# Patient Record
Sex: Male | Born: 1990 | Race: White | Hispanic: No | Marital: Single | Smoking: Never smoker
Health system: Southern US, Community
[De-identification: ages and names within clinical notes are randomized; demographics above are authoritative.]

## PROBLEM LIST (undated history)

## (undated) DIAGNOSIS — G43909 Migraine, unspecified, not intractable, without status migrainosus: Secondary | ICD-10-CM

## (undated) HISTORY — PX: TONSILLECTOMY: SUR1361

## (undated) HISTORY — DX: Migraine, unspecified, not intractable, without status migrainosus: G43.909

## (undated) HISTORY — PX: MYRINGOTOMY: SUR874

---

## 2014-10-17 LAB — LIPID PANEL
Cholesterol: 212 mg/dL — AB (ref 0–200)
HDL: 30 mg/dL — AB (ref 35–70)
LDL CALC: 182 mg/dL
TRIGLYCERIDES: 215 mg/dL — AB (ref 40–160)

## 2014-10-17 LAB — BASIC METABOLIC PANEL
BUN: 10 mg/dL (ref 4–21)
Creatinine: 0.9 mg/dL (ref 0.6–1.3)
GLUCOSE: 85 mg/dL
Sodium: 140 mmol/L (ref 137–147)

## 2015-04-24 ENCOUNTER — Other Ambulatory Visit: Payer: Self-pay | Admitting: Physician Assistant

## 2015-04-24 DIAGNOSIS — R1310 Dysphagia, unspecified: Secondary | ICD-10-CM

## 2015-04-26 ENCOUNTER — Ambulatory Visit
Admission: RE | Admit: 2015-04-26 | Discharge: 2015-04-26 | Disposition: A | Discharge status: Home / Self Care | Payer: Managed Care, Other (non HMO) | Source: Ambulatory Visit | Attending: Physician Assistant | Admitting: Physician Assistant

## 2015-04-26 DIAGNOSIS — R1310 Dysphagia, unspecified: Secondary | ICD-10-CM

## 2015-05-18 ENCOUNTER — Emergency Department (HOSPITAL_COMMUNITY): Payer: Managed Care, Other (non HMO)

## 2015-05-18 ENCOUNTER — Emergency Department (HOSPITAL_COMMUNITY)
Admission: EM | Admit: 2015-05-18 | Discharge: 2015-05-18 | Disposition: A | Discharge status: Home / Self Care | Payer: Managed Care, Other (non HMO) | Attending: Emergency Medicine | Admitting: Emergency Medicine

## 2015-05-18 ENCOUNTER — Encounter (HOSPITAL_COMMUNITY): Payer: Self-pay | Admitting: Emergency Medicine

## 2015-05-18 DIAGNOSIS — F419 Anxiety disorder, unspecified: Secondary | ICD-10-CM | POA: Diagnosis present

## 2015-05-18 DIAGNOSIS — J449 Chronic obstructive pulmonary disease, unspecified: Secondary | ICD-10-CM | POA: Diagnosis not present

## 2015-05-18 DIAGNOSIS — R0789 Other chest pain: Secondary | ICD-10-CM | POA: Diagnosis present

## 2015-05-18 DIAGNOSIS — R000 Tachycardia, unspecified: Secondary | ICD-10-CM | POA: Diagnosis present

## 2015-05-18 DIAGNOSIS — R0602 Shortness of breath: Secondary | ICD-10-CM | POA: Diagnosis present

## 2015-05-18 DIAGNOSIS — I471 Supraventricular tachycardia: Secondary | ICD-10-CM | POA: Diagnosis not present

## 2015-05-18 DIAGNOSIS — K219 Gastro-esophageal reflux disease without esophagitis: Secondary | ICD-10-CM | POA: Diagnosis not present

## 2015-05-18 DIAGNOSIS — J439 Emphysema, unspecified: Secondary | ICD-10-CM | POA: Insufficient documentation

## 2015-05-18 DIAGNOSIS — D72829 Elevated white blood cell count, unspecified: Secondary | ICD-10-CM | POA: Diagnosis present

## 2015-05-18 DIAGNOSIS — R350 Frequency of micturition: Secondary | ICD-10-CM | POA: Diagnosis present

## 2015-05-18 LAB — CBC WITH DIFFERENTIAL/PLATELET
Basophils Absolute: 0 10*3/uL (ref 0.0–0.1)
Basophils Relative: 0 % (ref 0–1)
Eosinophils Absolute: 0.1 10*3/uL (ref 0.0–0.7)
Eosinophils Relative: 0 % (ref 0–5)
HEMATOCRIT: 43.2 % (ref 39.0–52.0)
HEMOGLOBIN: 15.2 g/dL (ref 13.0–17.0)
LYMPHS ABS: 1.7 10*3/uL (ref 0.7–4.0)
Lymphocytes Relative: 13 % (ref 12–46)
MCH: 28.7 pg (ref 26.0–34.0)
MCHC: 35.2 g/dL (ref 30.0–36.0)
MCV: 81.5 fL (ref 78.0–100.0)
MONO ABS: 0.5 10*3/uL (ref 0.1–1.0)
MONOS PCT: 4 % (ref 3–12)
NEUTROS ABS: 11.5 10*3/uL — AB (ref 1.7–7.7)
NEUTROS PCT: 84 % — AB (ref 43–77)
Platelets: 149 10*3/uL — ABNORMAL LOW (ref 150–400)
RBC: 5.3 MIL/uL (ref 4.22–5.81)
RDW: 13 % (ref 11.5–15.5)
WBC: 13.8 10*3/uL — ABNORMAL HIGH (ref 4.0–10.5)

## 2015-05-18 LAB — URINALYSIS, ROUTINE W REFLEX MICROSCOPIC
BILIRUBIN URINE: NEGATIVE
Glucose, UA: NEGATIVE mg/dL
Hgb urine dipstick: NEGATIVE
KETONES UR: 15 mg/dL — AB
LEUKOCYTES UA: NEGATIVE
NITRITE: NEGATIVE
PH: 7.5 (ref 5.0–8.0)
Protein, ur: NEGATIVE mg/dL
UROBILINOGEN UA: 1 mg/dL (ref 0.0–1.0)

## 2015-05-18 LAB — I-STAT ARTERIAL BLOOD GAS, ED
Bicarbonate: 24.4 mEq/L — ABNORMAL HIGH (ref 20.0–24.0)
O2 SAT: 96 %
TCO2: 26 mmol/L (ref 0–100)
pCO2 arterial: 39.3 mmHg (ref 35.0–45.0)
pH, Arterial: 7.401 (ref 7.350–7.450)
pO2, Arterial: 79 mmHg — ABNORMAL LOW (ref 80.0–100.0)

## 2015-05-18 LAB — RAPID URINE DRUG SCREEN, HOSP PERFORMED
Amphetamines: NOT DETECTED
Barbiturates: NOT DETECTED
Benzodiazepines: NOT DETECTED
COCAINE: NOT DETECTED
OPIATES: NOT DETECTED
TETRAHYDROCANNABINOL: NOT DETECTED

## 2015-05-18 LAB — BASIC METABOLIC PANEL
Anion gap: 11 (ref 5–15)
BUN: 8 mg/dL (ref 6–20)
CALCIUM: 9.3 mg/dL (ref 8.9–10.3)
CHLORIDE: 105 mmol/L (ref 101–111)
CO2: 22 mmol/L (ref 22–32)
Creatinine, Ser: 0.82 mg/dL (ref 0.61–1.24)
GFR calc Af Amer: >60 mL/min (ref >60)
Glucose, Bld: 141 mg/dL — ABNORMAL HIGH (ref 65–99)
Potassium: 3.3 mmol/L — ABNORMAL LOW (ref 3.5–5.1)
Sodium: 138 mmol/L (ref 135–145)

## 2015-05-18 LAB — I-STAT TROPONIN, ED: TROPONIN I, POC: 0 ng/mL (ref 0.00–0.08)

## 2015-05-18 LAB — TROPONIN I: TROPONIN I: 0.04 ng/mL — AB (ref <0.031)

## 2015-05-18 LAB — TSH: TSH: 1.951 u[IU]/mL (ref 0.350–4.500)

## 2015-05-18 LAB — D-DIMER, QUANTITATIVE (NOT AT ARMC)

## 2015-05-18 MED ORDER — IOHEXOL 350 MG/ML SOLN
100.0000 mL | Freq: Once | INTRAVENOUS | Status: AC | PRN
  Administered 2015-05-18: 60 mL via INTRAVENOUS

## 2015-05-18 MED ORDER — PANTOPRAZOLE SODIUM 20 MG PO TBEC: 40.0000 mg | DELAYED_RELEASE_TABLET | Freq: Every day | ORAL | Status: DC

## 2015-05-18 MED ORDER — SODIUM CHLORIDE 0.9 % IV BOLUS (SEPSIS)
1000.0000 mL | Freq: Once | INTRAVENOUS | Status: AC
  Administered 2015-05-18: 1000 mL via INTRAVENOUS

## 2015-05-18 MED ORDER — LORAZEPAM 1 MG PO TABS: 0.5000 mg | ORAL_TABLET | Freq: Three times a day (TID) | ORAL | Status: DC | PRN

## 2015-05-18 MED ORDER — GI COCKTAIL ~~LOC~~: 30.0000 mL | Freq: Three times a day (TID) | ORAL | Status: DC | PRN

## 2015-05-18 MED ORDER — POTASSIUM CHLORIDE CRYS ER 20 MEQ PO TBCR
40.0000 meq | EXTENDED_RELEASE_TABLET | Freq: Once | ORAL | Status: AC
  Administered 2015-05-18: 40 meq via ORAL
  Filled 2015-05-18: qty 2

## 2015-05-18 MED ORDER — LORAZEPAM 2 MG/ML IJ SOLN
0.5000 mg | Freq: Once | INTRAMUSCULAR | Status: AC
  Administered 2015-05-18: 0.5 mg via INTRAVENOUS
  Filled 2015-05-18: qty 1

## 2015-05-18 NOTE — Discharge Instructions (Signed)
Please follow up with your primary care provider in the next week.  Please take your prescription for Ativan as needed and take  Protonix once daily.  Please contact Pulmonology for a follow up appointment regarding lung bulla seen on CT chest scan.  Please return to the Emergency Department if symptoms worsen.

## 2015-05-18 NOTE — ED Provider Notes (Signed)
CSN: [id number]     Arrival date & time 05/18/15  0004 History   First MD Initiated Contact with Patient 05/18/15 0304     Chief Complaint  Patient presents with  . Shortness of Breath     (Consider location/radiation/quality/duration/timing/severity/associated sxs/prior Treatment) Patient is a 24 y.o. male presenting with shortness of breath. The history is provided by the patient. No language interpreter was used.  Shortness of Breath Severity:  Moderate Onset quality:  Gradual Duration:  8 hours Timing:  Constant Progression:  Unchanged Chronicity:  New Context: not activity, not animal exposure, not fumes, not occupational exposure, not pollens, not smoke exposure, not strong odors and not URI   Relieved by:  Nothing Worsened by:  Activity, exertion and emotional stress Ineffective treatments:  None tried Associated symptoms: cough   Associated symptoms: no abdominal pain, no chest pain, no claudication, no fever, no headaches, no hemoptysis, no sore throat, no sputum production, no vomiting and no wheezing   Cough:    Cough characteristics:  Dry   Severity:  Moderate   Onset quality:  Gradual   Duration:  2 days   Timing:  Intermittent   Progression:  Unchanged   Chronicity:  New Risk factors: no recent alcohol use, no family hx of DVT, no hx of cancer, no hx of PE/DVT, no obesity, no oral contraceptive use, no prolonged immobilization, no recent surgery and no tobacco use   Risk factors comment:  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 No pertinent past medical history. Past Surgical History  Procedure Laterality Date  . Tonsillectomy    . Myringotomy     No family history on file. Social History  Substance Use Topics  . Smoking status: Never Smoker   . Smokeless tobacco: None  . Alcohol Use: No    Review of Systems  Constitutional: Negative for fever.  HENT: Negative for sore throat.   Respiratory: Positive for cough and  shortness of breath. Negative for hemoptysis, sputum production and wheezing.   Cardiovascular: Negative for chest pain and claudication.  Gastrointestinal: Negative for vomiting and abdominal pain.  Neurological: Negative for headaches.  All other systems reviewed and are negative.     Allergies  Review of patient's allergies indicates no known allergies.  Home Medications   Prior to Admission medications   Not on File   BP 149/85 mmHg  Pulse 125  Temp(Src) 98.8 F (37.1 C) (Oral)  Resp 22  SpO2 97% Physical Exam  Constitutional: He is oriented to person, place, and time. He appears well-developed and well-nourished. No distress.  HENT:  Head: Normocephalic and atraumatic.  Eyes: Conjunctivae and EOM are normal.  Neck: Normal range of motion.  Cardiovascular: Regular rhythm.  Exam reveals no gallop and no friction rub.   No murmur heard. tachycardic  Pulmonary/Chest: Effort normal and breath sounds normal. He has no wheezes. He has no rales. He exhibits no tenderness.  Abdominal: Soft. He exhibits no distension. There is no tenderness. There is no rebound.  Musculoskeletal: Normal range of motion.  Neurological: He is alert and oriented to person, place, and time. Coordination normal.  Speech is goal-oriented. Moves limbs without ataxia.   Skin: Skin is warm and dry.  Psychiatric: He has a normal mood and affect. His behavior is normal.  Nursing note and vitals reviewed.   ED Course  Procedures (including critical care time) Labs Review Labs Reviewed  CBC WITH DIFFERENTIAL/PLATELET - Abnormal; Notable for the  following:    WBC 13.8 (*)    Platelets 149 (*)    Neutrophils Relative % 84 (*)    Neutro Abs 11.5 (*)    All other components within normal limits  BASIC METABOLIC PANEL - Abnormal; Notable for the following:    Potassium 3.3 (*)    Glucose, Bld 141 (*)    All other components within normal limits  TROPONIN I - Abnormal; Notable for the following:     Troponin I 0.04 (*)    All other components within normal limits  URINALYSIS, ROUTINE W REFLEX MICROSCOPIC (NOT AT [facility]) - Abnormal; Notable for the following:    Specific Gravity, Urine <1.005 (*)    Ketones, ur 15 (*)    All other components within normal limits  I-STAT ARTERIAL BLOOD GAS, ED - Abnormal; Notable for the following:    pO2, Arterial 79.0 (*)    Bicarbonate 24.4 (*)    All other components within normal limits  CULTURE, BLOOD (ROUTINE X 2)  CULTURE, BLOOD (ROUTINE X 2)  URINE CULTURE  D-DIMER, QUANTITATIVE (NOT AT [facility])  URINE RAPID DRUG SCREEN, HOSP PERFORMED  TSH  TROPONIN I  Rosezena Sensor, ED    Imaging Review Dg Chest 2 View  05/18/2015   CLINICAL DATA:  24 year old male with shortness of breath and cough  EXAM: CHEST  2 VIEW  COMPARISON:  None.  FINDINGS: The heart size and mediastinal contours are within normal limits. Both lungs are clear. The visualized skeletal structures are unremarkable.  IMPRESSION: No active cardiopulmonary disease.   Electronically Signed   By: Elgie Collard M.D.   On: 05/18/2015 00:32   Ct Angio Chest Pe W/cm &/or Wo Cm  05/18/2015   CLINICAL DATA:  Shortness of breath, worse with exertion.  EXAM: CT ANGIOGRAPHY CHEST WITH CONTRAST  TECHNIQUE: Multidetector CT imaging of the chest was performed using the standard protocol during bolus administration of intravenous contrast. Multiplanar CT image reconstructions and MIPs were obtained to evaluate the vascular anatomy.  CONTRAST:  60mL OMNIPAQUE IOHEXOL 350 MG/ML SOLN  COMPARISON:  Chest radiograph dated 05/18/2015.  FINDINGS: There is no evidence of pulmonary embolus.  The heart and great vessels are normal. Residual thymus is seen. There is no evidence of significant focal lung parenchymal consolidation, pleural effusion or pneumothorax. No masses are identified ; no abnormal focal contrast enhancement is seen. There is a single subpleural bulla within the superior segment of the left lower  lobe measuring 2.3 cm. , incidental finding.  No axillary lymphadenopathy is seen. The visualized portions of the thyroid gland are unremarkable in appearance.  The visualized portions of the liver, gallbladder, stomach and spleen are unremarkable.  No acute osseous abnormalities are seen.  Review of the MIP images confirms the above findings.  IMPRESSION: No evidence of pulmonary embolus.  No evidence of acute cardiopulmonary disease.   Electronically Signed   By: Ted Mcalpine M.D.   On: 05/18/2015 08:13   I have personally reviewed and evaluated these images and lab results as part of my medical decision-making.   EKG Interpretation   Date/Time:  Thursday May 18 2015 00:09:56 EDT Ventricular Rate:  146 PR Interval:  128 QRS Duration: 78 QT Interval:  296 QTC Calculation: 461 R Axis:   95 Text Interpretation:  Sinus tachycardia Rightward axis T wave abnormality,  consider inferior ischemia Abnormal ECG No old tracing to compare  Confirmed by [facility]  MD, DAVID ([id number]) on 05/18/2015 12:13:07 AM  MDM   Final diagnoses:  SOB (shortness of breath)  Tachycardia  Anxiety  Lung bullae    3:28 AM Labs pending. EKG shows sinus tachycardia with rightward axis. Patient persistently tachycardic.   Patient will have CT angio of chest to rule out PE. Patient signed out to Melburn Hake, PA-C.   Emilia Beck, PA-C 05/18/15 1610  Dione Booze, MD 05/18/15 2234

## 2015-05-18 NOTE — Consult Note (Signed)
[facility] Medical Consultation  Earl Foster ZOX:[id number] DOB: Nov 22, 1990 DOA: 05/18/2015 PCP: Gardenia Phlegm   Requesting physician: Melburn Hake, PA-C Date of consultation: 05/18/2015 Reason for consultation: Consideration for admission for tachycardia.  Impression/Recommendations Active Problems:   Sinus tachycardia   Anxiety   Atypical chest pain   Bleb, lung   Leukocytosis   Urinary frequency   GERD (gastroesophageal reflux disease)   PCP follow up recommendations:  Please see the patient within the next 7 days for ER follow up. 1.  Evaluation of any continued CP or tachycardia. 2.  Pulmonary follow up for lung bulla and chronic allergy / asthma symptoms. 3.  PPI therapy for GERD 4.  Treatment for anxiety - or possible specialist referral  5.  Continued Follow up of leukocytosis  6.  Follow TSH. 7.  Check CBC / BMET  Narrow Complex Sinus Tachycardia Likely secondary to anxiety/panic attack.  CTA chest ruled out PE. Troponin is minimally elevated.  Will check a second troponin, TSH, UDS Treated in the ER with ativan and IV fluids and improved.   Recommend the patient follow up with his PCP for on-going treatment and evaluation.  Please follow TSH.  Atypical chest pain Described as intermittent, burning and pinpoint.  He rates his pain as a "0.01".  EKG with out ST changes.  Troponin minimally elevated.  Will check a second troponin prior to d/c from the ER. Patient had a recent UGI that demonstrated moderate reflux.  Treated with GI cocktail in the ER. Recommend discharging the patient from the ER on a daily PPI such as protonix.  Solitary Left lobe lung bulla Incidental finding on CTA chest (results are listed below). I do not believe his symptoms stem from this bulla. May consider checking an alpha 1 antitrypsin antibody as an outpatient.  I briefly discussed this with the Pulmonology PA who recommended pulmonology follow up given his history of  allergies and asthma.  EDP agreed to provide information to the patient regarding Pulmonology follow up.  Urinary frequency Will check U/A prior to d/c from the ER.  Doubt infection.   GERD Recommend initiation of PPI therapy at ER discharge.  To be followed by PCP.   May need upper endoscopy at some point.  Leukocytosis Patient states his WBC was elevated at his last PCP visit.  May be chronically elevated.  CXR clear for infection.  Checking U/A prior to d/c from the ER.  Recommend follow up with his PCP.  Hypokalemia Given 40 meq of oral potassium in the ER prior to D/C.  Please follow bmet.   Chief Complaint: Shortness of breath and "minor" chest pain  HPI:  Earl Foster  is a 24 y.o. male, who has migraine headaches,reflux, allergies and anxiety, developed a few hours of SOB at home on 9/7 and decided to come to the ER to get it checked out.  While driving himself here he began to worry and developed a full panic attack.  In the ER he was being treated for anxiety when he developed a minor - dull, burning chest pain in his left chest.  The pain comes and goes and has decreased with the treatment for anxiety.  He has had a dry cough for several days, but attributes this to allergies.  Specifically he has a new pet [place] pig and coughs when he is in the same room with it.    He had a recent UGI on 8/17 which showed moderate reflux.  In the ER  his peak pulse rate was 138, EKG showed sinus tach.  Troponin was 0.04.  WBC was elevated at 13.8, platelets 149, potassium 3.3.   CTA was negative for PE but had an incidental finding of a 2.3 single pleural bleb.  Review of Systems:  He reports increased frequency of urine, occasional headaches and reflux. He denies changes in vision, dizziness, sore throat, fever, anorexia, productive cough, severe chest pain, dyspnea on exertion, orthopnea, PND, abdominal pain, vomiting, diarrhea, constipation, pain with urination, myalgias, pain in his  extremities.  History reviewed. No pertinent past medical history. Past Surgical History  Procedure Laterality Date  . Tonsillectomy    . Myringotomy     Social History:  reports that he has never smoked. He does not have any smokeless tobacco history on file. He reports that he does not drink alcohol or use illicit drugs.  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 He is independent with his ADLs.  No Known Allergies  No family history known. The patient is adopted.       Physical Exam: Filed Vitals:   05/18/15 0859 05/18/15 0900 05/18/15 0915 05/18/15 1000  BP: 140/89 139/89  144/86  Pulse: 114 97 114 105  Temp: 98.2 F (36.8 C)     TempSrc: Oral     Resp: 21 21 22 23   SpO2: 99% 95% 99% 95%      General:  Young, well-developed well-nourished male standing in his room in no apparent distress.  Eyes: Pupils equal and round, sclera clear conjunctiva pink  ENT: No exudates or erythema  Neck: Supple, no frank lymphadenopathy.  Cardiovascular: Tachycardic without murmurs rubs or gallops.  Respiratory:  Clear to auscultation, no wheezes crackles or rales.  Abdomen: Soft, nontender, nondistended, positive bowel sounds, no masses, slightly protuberant  Skin: No obvious rashes bruises or lesions.  Musculoskeletal: Able to move all 4 extremities 5 over 5 strength in each.  Psychiatric: Awake, alert and oriented 4, appropriate, mildly anxious.  Neurologic: No focal neuro deficits, cranial nerves II through XII are grossly intact.  Labs on Admission:  Basic Metabolic Panel:  Recent Labs Lab 05/18/15 0340  NA 138  K 3.3*  CL 105  CO2 22  GLUCOSE 141*  BUN 8  CREATININE 0.82  CALCIUM 9.3   CBC:  Recent Labs Lab 05/18/15 0340  WBC 13.8*  NEUTROABS 11.5*  HGB 15.2  HCT 43.2  MCV 81.5  PLT 149*   Cardiac Enzymes:  Recent Labs Lab 05/18/15 0340  TROPONINI 0.04*    Radiological Exams on Admission: Dg Chest 2 View  05/18/2015   CLINICAL DATA:  25 year old  male with shortness of breath and cough  EXAM: CHEST  2 VIEW  COMPARISON:  None.  FINDINGS: The heart size and mediastinal contours are within normal limits. Both lungs are clear. The visualized skeletal structures are unremarkable.  IMPRESSION: No active cardiopulmonary disease.   Electronically Signed   By: Elgie Collard M.D.   On: 05/18/2015 00:32   Ct Angio Chest Pe W/cm &/or Wo Cm  05/18/2015   CLINICAL DATA:  Shortness of breath, worse with exertion.  EXAM: CT ANGIOGRAPHY CHEST WITH CONTRAST  TECHNIQUE: Multidetector CT imaging of the chest was performed using the standard protocol during bolus administration of intravenous contrast. Multiplanar CT image reconstructions and MIPs were obtained to evaluate the vascular anatomy.  CONTRAST:  60mL OMNIPAQUE IOHEXOL 350 MG/ML SOLN  COMPARISON:  Chest radiograph dated 05/18/2015.  FINDINGS: There is no evidence of pulmonary embolus.  The heart and great vessels are normal. Residual thymus is seen. There is no evidence of significant focal lung parenchymal consolidation, pleural effusion or pneumothorax. No masses are identified ; no abnormal focal contrast enhancement is seen. There is a single subpleural bulla within the superior segment of the left lower lobe measuring 2.3 cm. , incidental finding.  No axillary lymphadenopathy is seen. The visualized portions of the thyroid gland are unremarkable in appearance.  The visualized portions of the liver, gallbladder, stomach and spleen are unremarkable.  No acute osseous abnormalities are seen.  Review of the MIP images confirms the above findings.  IMPRESSION: No evidence of pulmonary embolus.  No evidence of acute cardiopulmonary disease.   Electronically Signed   By: Ted Mcalpine M.D.   On: 05/18/2015 08:13    EKG: Sinus Tach  Time spent: 40 min.  [place] [facility] Pager [phone]  If 7PM-7AM, please contact night-coverage [email or web address] Password [email or web address] 05/18/2015, 11:19  AM

## 2015-05-18 NOTE — ED Notes (Signed)
Pt. reports SOB worse with exertion and anxiety attack this evening with occasional dry cough , denies fever or chills.

## 2015-05-18 NOTE — ED Notes (Signed)
Provider at bedside

## 2015-05-18 NOTE — ED Notes (Signed)
NAD at this time. Pt is stable and going home.  

## 2015-05-18 NOTE — ED Provider Notes (Signed)
Pt presents with SOB worse with exertion and CP associated with an anxiety attack reported by the pt last night. Endorses dry cough. Denies fever, chills, diaphoresis, abdominal pain, N/V/D, numbness, tingling, weakness.   VSS with exception of tachycardia, afebrile. Lungs CTAB. Heart exam revealed normal S1, S2, no murmur, tachycardia.   EKG revealed tachycardia. Pt given IVF. Initial troponin 0.04. BMP unremarkable. WBC 13.8. Delta troponin negative. D-dimer negative. CXR negative. CT angio chest showed no evidence of PE or acute cardiopulmonary disease, subpleural bulla noted in left lower lobe (incidental finding).   Pt continues to remain tachycardic with HR in 120s. He reports SOB has improved. Pt given 0.5mg  Ativan. He continues to remain in 120s. Consulted hospitalist, Dr. Janee Morn will see pt in ED. Dr. Janee Morn recommended that pt does not need admission at this time. Hospitalist placed orders for IVF and also ordered TSH, UA, UDS, blood cultures, and another troponin. TSH pending, UDS negative, UA unremarkable, troponin negative. Discussed plan to follow up with pt's PCP in the next week, follow up with results from TSH with PCP, d/c pt home with PPI and ativan, and have pt follow up with Pulmonology regarding incidental bulla finding on CT. Discussed plan for d/c with pt and pt agrees with plan.   Evaluation does not show pathology requring ongoing emergent intervention or admission. Pt is hemodynamically stable and mentating appropriately. Discussed findings/results and plan with patient/guardian, who agrees with plan. All questions answered. Return precautions discussed and outpatient follow up given.    Satira Sark [place], [place] 05/18/15 1218  Bethann Berkshire, MD 05/19/15 [phone]

## 2015-05-19 LAB — URINE CULTURE

## 2015-05-23 LAB — CULTURE, BLOOD (ROUTINE X 2)
CULTURE: NO GROWTH
Culture: NO GROWTH

## 2015-05-30 ENCOUNTER — Ambulatory Visit (INDEPENDENT_AMBULATORY_CARE_PROVIDER_SITE_OTHER): Payer: Managed Care, Other (non HMO) | Admitting: Internal Medicine

## 2015-05-30 ENCOUNTER — Institutional Professional Consult (permissible substitution): Payer: Managed Care, Other (non HMO) | Admitting: Internal Medicine

## 2015-05-30 ENCOUNTER — Encounter: Payer: Self-pay | Admitting: Internal Medicine

## 2015-05-30 VITALS — BP 134/90 | HR 90 | Ht 71.0 in | Wt 196.0 lb

## 2015-05-30 DIAGNOSIS — R05 Cough: Secondary | ICD-10-CM | POA: Diagnosis not present

## 2015-05-30 DIAGNOSIS — J439 Emphysema, unspecified: Secondary | ICD-10-CM

## 2015-05-30 DIAGNOSIS — F419 Anxiety disorder, unspecified: Secondary | ICD-10-CM | POA: Diagnosis not present

## 2015-05-30 DIAGNOSIS — R058 Other specified cough: Secondary | ICD-10-CM

## 2015-05-30 NOTE — Patient Instructions (Addendum)
Pantoprazole 40 mg Take 30- 60 min before your first and last meals of the day   GERD (REFLUX)  is an extremely common cause of respiratory symptoms just like yours , many times with no obvious heartburn at all.    It can be treated with medication, but also with lifestyle changes including elevation of the head of your bed (ideally with 6 inch  bed blocks),  Smoking cessation, avoidance of late meals, excessive alcohol, and avoid fatty foods, chocolate, peppermint, colas, red wine, and acidic juices such as orange juice.  NO MINT OR MENTHOL PRODUCTS SO NO COUGH DROPS  USE SUGARLESS CANDY INSTEAD (Jolley ranchers or Stover's or Life Savers) or even ice chips will also do - the key is to swallow to prevent all throat clearing. NO OIL BASED VITAMINS - use powdered substitutes.    Weight control is simply a matter of calorie balance which needs to be tilted in your favor by eating less and exercising more.  To get the most out of exercise, you need to be continuously aware that you are short of breath, but never out of breath, for 30 minutes daily. As you improve, it will actually be easier for you to do the same amount of exercise  in  30 minutes so always push to the level where you are short of breath.    Pulmonary follow up is as needed

## 2015-05-30 NOTE — Progress Notes (Signed)
Subjective:    Patient ID: Earl Foster, male    DOB: 1991-06-20,     MRN: [id number]  HPI  24 yowm born 6 weeks prematurely [place] with ear infections and asthma dx while living [place] 2002 [place] with sob requiring lots of inhalers and allergy while in [place] then better after moved out s need need for shots or inhalers referred to pulmonary clinic 05/30/2015 by Dr Altamease Oiler Redmon p er eval for palpitations > dx  Panic disorder and  bullous lung dz and referred to pulmonary clinic 05/30/2015    05/30/2015 1st [facility] office visit/ [facility]   Chief Complaint  Patient presents with  . Pulmonary Consult    Referred by Milus Height, PA. Pt c/o CP for the past month. He states that his CP is always present, but gets worse at times.  He also c/o occ "pounding and fluttering".  He also c/o non prod cough that started approx 1 wk prior to onset of CP.    onset of cough was relatively abrupt around  05/11/15 then cp very localized area L parasternal just inf to [place]  not made worse by coughing but  continuous 24/7 but worse secs/ min does not wake him up but "made him anxious which made him short of breath"> ER 05/18/15  with small bulla post/inferiorly on L   Cough is dry day > noct assoc with new mild dysphagia and has documented gerd but not taking ppi presribed   No obvious other patterns in day to day or daytime variabilty or assoc   chest tightness, subjective wheeze overt sinus or hb symptoms. No unusual exp hx    Sleeping ok without nocturnal  or early am exacerbation  of respiratory  c/o's or need for noct saba. Also denies any obvious fluctuation of symptoms with weather or environmental changes or other aggravating or alleviating factors except as outlined above   Current Medications, Allergies, Complete Past Medical History, Past Surgical History, Family History, and Social History were reviewed in [employer] record.      Review of Systems    Constitutional: Negative for fever, chills, activity change, appetite change and unexpected weight change.  HENT: Positive for trouble swallowing. Negative for congestion, dental problem, postnasal drip, rhinorrhea, sneezing, sore throat and voice change.   Eyes: Negative for visual disturbance.  Respiratory: Positive for cough. Negative for choking and shortness of breath.   Cardiovascular: Positive for chest pain. Negative for leg swelling.  Gastrointestinal: Negative for nausea, vomiting and abdominal pain.  Genitourinary: Negative for difficulty urinating.       Indigestion  Musculoskeletal: Negative for arthralgias.  Skin: Negative for rash.  Psychiatric/Behavioral: Negative for behavioral problems and confusion.       Objective:   Physical Exam  amb wm nad  Wt Readings from Last 3 Encounters:  05/30/15 196 lb (88.905 kg)    Vital signs reviewed  HEENT: nl dentition, turbinates, and orophanx. Nl external ear canals without cough reflex   NECK :  without JVD/Nodes/TM/ nl carotid upstrokes bilaterally   LUNGS: no acc muscle use, clear to A and P bilaterally without cough on insp or exp maneuvers   CV:  RRR  no s3 or murmur or increase in P2, no edema   ABD:  soft and nontender with nl excursion in the supine position. No bruits or organomegaly, bowel sounds nl  MS:  warm without deformities, calf tenderness, cyanosis or clubbing  SKIN: warm  and dry without lesions    NEURO:  alert, approp, no deficits     I personally reviewed images and agree with radiology impression as follows:  CTa chest:  05/18/15 No evidence of pulmonary embolus. No evidence of acute cardiopulmonary disease.  single subpleural bulla within the superior segment of the left lower lobe measuring 2.3 cm  05/18/15 ER labs reviewed and were unremarkable   04/26/15 DgEs 1. Mild to moderate gastroesophageal reflux. No hiatal hernia. 2. Barium pill passes into the stomach without delay. 3.  Retained food debris within the stomach but no significant abnormality is seen.         Assessment & Plan:

## 2015-05-31 DIAGNOSIS — R058 Other specified cough: Secondary | ICD-10-CM | POA: Insufficient documentation

## 2015-05-31 DIAGNOSIS — R05 Cough: Secondary | ICD-10-CM | POA: Insufficient documentation

## 2015-05-31 NOTE — Assessment & Plan Note (Addendum)
The most common causes of chronic cough in immunocompetent adults include the following: upper airway cough syndrome (UACS), previously referred to as postnasal drip syndrome (PNDS), which is caused by variety of rhinosinus conditions; (2) asthma; (3) GERD; (4) chronic bronchitis from cigarette smoking or other inhaled environmental irritants; (5) nonasthmatic eosinophilic bronchitis; and (6) bronchiectasis.   These conditions, singly or in combination, have accounted for up to 94% of the causes of chronic cough in prospective studies.   Other conditions have constituted no >6% of the causes in prospective studies These have included bronchogenic carcinoma, chronic interstitial pneumonia, sarcoidosis, left ventricular failure, ACEI-induced cough, and aspiration from a condition associated with pharyngeal dysfunction.    Chronic cough is often simultaneously caused by more than one condition. A single cause has been found from 38 to 82% of the time, multiple causes from 18 to 62%. Multiply caused cough has been the result of three diseases up to 42% of the time.       Based on hx and exam, this is most likely:  Classic Upper airway cough syndrome, so named because it's frequently impossible to sort out how much is  CR/sinusitis with freq throat clearing (which can be related to primary GERD)   vs  causing  secondary (" extra esophageal")  GERD from wide swings in gastric pressure that occur with throat clearing, often  promoting self use of mint and menthol lozenges that reduce the lower esophageal sphincter tone and exacerbate the problem further in a cyclical fashion.   These are the same pts (now being labeled as having "irritable larynx syndrome" by some cough centers) who not infrequently have a history of having failed to tolerate ace inhibitors,  dry powder inhalers or biphosphonates or report having atypical reflux symptoms that don't respond to standard doses of PPI , and are easily confused as  having aecopd or asthma flares by even experienced allergists/ pulmonologists.   The first step is to maximize GERD Rx then regroup in 6 weeks if cough persists or cp does not resolve  I had an extended discussion with the patient reviewing all relevant studies completed to date and  lasting 35 min  1) Explained: The standardized cough guidelines published in Chest by [place] in 2006 are still the best available and consist of a multiple step process (up to 12!) , not a single office visit,  and are intended  to address this problem logically,  with an alogrithm dependent on response to empiric treatment at  each progressive step  to determine a specific diagnosis with  minimal addtional testing needed. Therefore if adherence is an issue or can't be accurately verified,  it's very unlikely the standard evaluation and treatment will be successful here.    Furthermore, response to therapy (other than acute cough suppression, which should only be used short term with avoidance of narcotic containing cough syrups if possible), can be a gradual process for which the patient may perceive immediate benefit.  Unlike going to an eye doctor where the best perscription is almost always the first one and is immediately effective, this is almost never the case in the management of chronic cough syndromes. Therefore the patient needs to commit up front to consistently adhere to recommendations  for up to 6 weeks of therapy directed at the likely underlying problem(s) before the response can be reasonably evaluated.     2) Each maintenance medication was reviewed in detail including most importantly the difference between maintenance and prns  and under what circumstances the prns are to be triggered using an action plan format that is not reflected in the computer generated alphabetically organized AVS.    Please see instructions for details which were reviewed in writing and the patient given a copy  highlighting the part that I personally wrote and discussed at today's ov.   See instructions for specific recommendations which were reviewed directly with the patient who was given a copy with highlighter outlining the key components.

## 2015-05-31 NOTE — Assessment & Plan Note (Signed)
Note none of his symptoms worsen with ex which is the best non-pharmacologic way to treat anxiety/ panic disorder  See instructions for specific recommendations which were reviewed directly with the patient who was given a copy with highlighter outlining the key components.

## 2015-05-31 NOTE — Assessment & Plan Note (Addendum)
-  Born 6 weks premature but ? On vent ?  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     In the setting of obvious "macroscopic" health issues which are clearly not related to this bulla, told pt to focus on solving these first and unlikely this bulla will ever get bigger or cause ptx, esp if not going to extremes of altitude or submersion  Discussed in detail all the  indications, usual  risks and alternatives  relative to the benefits with patient who agrees to proceed with conservative f/u as outlined

## 2015-06-07 ENCOUNTER — Ambulatory Visit (INDEPENDENT_AMBULATORY_CARE_PROVIDER_SITE_OTHER): Payer: Managed Care, Other (non HMO) | Admitting: Neurology

## 2015-06-07 ENCOUNTER — Encounter: Payer: Self-pay | Admitting: Neurology

## 2015-06-07 VITALS — BP 122/66 | HR 70 | Ht 69.0 in | Wt 194.0 lb

## 2015-06-07 DIAGNOSIS — F419 Anxiety disorder, unspecified: Secondary | ICD-10-CM | POA: Diagnosis not present

## 2015-06-07 DIAGNOSIS — G43009 Migraine without aura, not intractable, without status migrainosus: Secondary | ICD-10-CM | POA: Diagnosis not present

## 2015-06-07 DIAGNOSIS — R413 Other amnesia: Secondary | ICD-10-CM | POA: Diagnosis not present

## 2015-06-07 MED ORDER — NAPROXEN SODIUM 550 MG PO TABS: 550.0000 mg | ORAL_TABLET | Freq: Two times a day (BID) | ORAL | Status: AC | PRN

## 2015-06-07 NOTE — Patient Instructions (Addendum)
Migraine Recommendations: 2.  Take Relpax (with or without naproxen ) at earliest onset of headache.  May repeat Relpax (not naproxen) once in 2 hours if needed.  Do not exceed two tablets in 24 hours. 3.  Limit use of pain relievers to no more than 2 days out of the week.  These medications include acetaminophen, ibuprofen, triptans and narcotics.  This will help reduce risk of rebound headaches. 4.  Be aware of common food triggers such as processed sweets, processed foods with nitrites (such as deli meat, hot dogs, sausages), foods with MSG, alcohol (such as wine), chocolate, certain cheeses, certain fruits (dried fruits, some citrus fruit), vinegar, diet soda. 4.  Avoid caffeine 5.  Routine exercise 6.  Proper sleep hygiene 7.  Stay adequately hydrated with water 8.  Keep a headache diary. 9.  Maintain proper stress management. 10.  Do not skip meals. 11.  Consider supplements:  Magnesium oxide  to  daily, riboflavin , Coenzyme Q 10  three times daily 12.  Will get MRI of brain without contrast 13.  Follow up in 3 months.   Your MRI of the brain is scheduled on 06/14/2015 Wed at 4pm
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
## 2015-06-14 ENCOUNTER — Ambulatory Visit (HOSPITAL_COMMUNITY)
Admission: RE | Admit: 2015-06-14 | Discharge: 2015-06-14 | Disposition: A | Discharge status: Home / Self Care | Payer: Managed Care, Other (non HMO) | Source: Ambulatory Visit | Attending: Neurology | Admitting: Neurology

## 2015-06-14 DIAGNOSIS — R413 Other amnesia: Secondary | ICD-10-CM

## 2015-06-14 DIAGNOSIS — F419 Anxiety disorder, unspecified: Secondary | ICD-10-CM

## 2015-06-14 DIAGNOSIS — G43009 Migraine without aura, not intractable, without status migrainosus: Secondary | ICD-10-CM

## 2015-06-22 ENCOUNTER — Telehealth: Payer: Self-pay | Admitting: Neurology

## 2015-06-22 NOTE — Telephone Encounter (Signed)
Pt called for a med to keep him calm for an upcoming MRI/ call back @ [phone]

## 2015-06-22 NOTE — Telephone Encounter (Signed)
Please advise 

## 2015-06-23 ENCOUNTER — Other Ambulatory Visit: Payer: Self-pay | Admitting: *Deleted

## 2015-06-23 MED ORDER — DIAZEPAM 5 MG PO TABS: 5.0000 mg | ORAL_TABLET | Freq: Once | ORAL | Status: DC

## 2015-06-23 NOTE — Telephone Encounter (Signed)
He can have one Valium 5mg  to take 15 to 30 minutes prior to MRI.  Due to drowsiness, he will need somebody to drive him to and from the exam.

## 2015-06-23 NOTE — Telephone Encounter (Signed)
Left message informing patient that I will fax Rx in for him today.

## 2015-09-14 ENCOUNTER — Ambulatory Visit: Payer: Managed Care, Other (non HMO) | Admitting: Neurology

## 2015-09-25 ENCOUNTER — Inpatient Hospital Stay: Admission: RE | Admit: 2015-09-25 | Payer: Managed Care, Other (non HMO) | Source: Ambulatory Visit

## 2015-10-10 ENCOUNTER — Ambulatory Visit: Payer: Managed Care, Other (non HMO) | Admitting: Neurology

## 2015-11-17 ENCOUNTER — Other Ambulatory Visit: Payer: Self-pay | Admitting: Neurology

## 2015-11-20 ENCOUNTER — Ambulatory Visit: Admission: RE | Admit: 2015-11-20 | Payer: Managed Care, Other (non HMO) | Source: Ambulatory Visit

## 2015-12-05 ENCOUNTER — Telehealth: Payer: Self-pay | Admitting: Neurology

## 2015-12-05 DIAGNOSIS — R413 Other amnesia: Secondary | ICD-10-CM

## 2015-12-05 NOTE — Telephone Encounter (Signed)
Pt could not do the MRI and would like to talk to someone please call [phone]

## 2015-12-05 NOTE — Telephone Encounter (Signed)
Called pt back. No answer. Left a vm to return call or send me a mychart message

## 2015-12-05 NOTE — Telephone Encounter (Signed)
Then we can get a CT of head instead

## 2015-12-05 NOTE — Telephone Encounter (Signed)
Patient called back. Was unable to go through with MRI due to his claustrophobia. He did have and take a Valium 5 mg.  Please advise.

## 2015-12-06 NOTE — Addendum Note (Signed)
Addended by: Sheilah MinsFOX, Donyell Carrell A on: 12/06/2015 02:01 PM   Modules accepted: Orders

## 2015-12-06 NOTE — Telephone Encounter (Signed)
Left message on machine for pt to return call to the office.  

## 2015-12-06 NOTE — Telephone Encounter (Signed)
Pt okay with going through with CT. Pt is scheduled for next Wednesday. Pt aware.

## 2015-12-13 ENCOUNTER — Inpatient Hospital Stay: Admission: RE | Admit: 2015-12-13 | Payer: Managed Care, Other (non HMO) | Source: Ambulatory Visit

## 2016-03-05 ENCOUNTER — Ambulatory Visit: Payer: Managed Care, Other (non HMO) | Admitting: Neurology

## 2016-03-05 DIAGNOSIS — Z029 Encounter for administrative examinations, unspecified: Secondary | ICD-10-CM

## 2016-09-08 IMAGING — RF DG UGI W/ HIGH DENSITY W/KUB
18 of 24 series · 18 of 24 positions shown · non-contrast
Comparison: None.

CLINICAL DATA: Dysphagia

EXAM:
UPPER GI SERIES WITH KUB
TECHNIQUE: After obtaining a scout radiograph a routine upper GI series was
performed using thin and high density barium.
FLUOROSCOPY TIME:  Radiation Exposure Index (as provided by the
fluoroscopic device): 64 Gy per sq cm
If the device does not provide the exposure index:
Fluoroscopy Time (in minutes and seconds):  1 minutes 54 seconds
Number of Acquired Images:

[Series 3: run · 1 of 1 slices shown (1 of 17)]
[im 1/1]
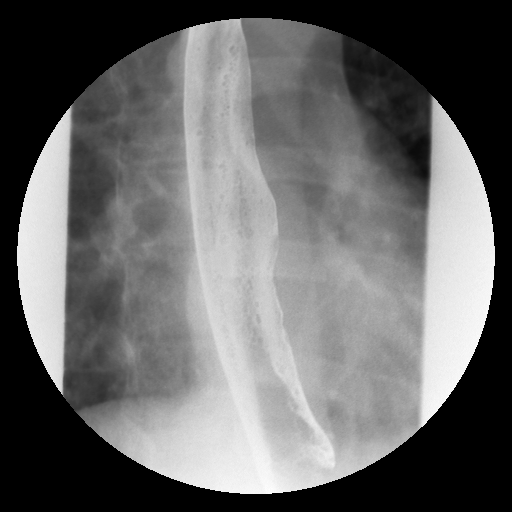

[Series 5: run · 1 of 1 slices shown (2 of 17)]
[im 1/1]
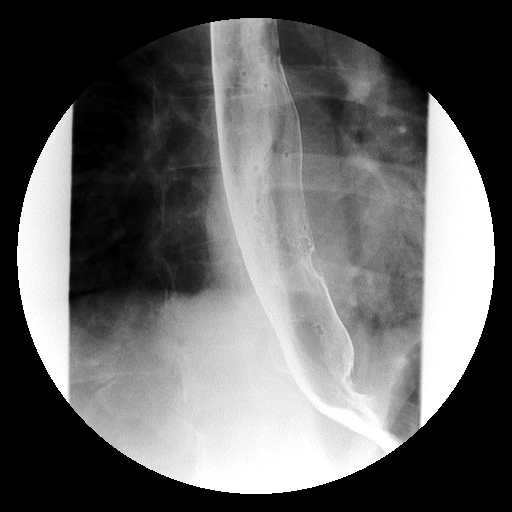

[Series 6: run · 1 of 1 slices shown (3 of 17)]
[im 1/1]
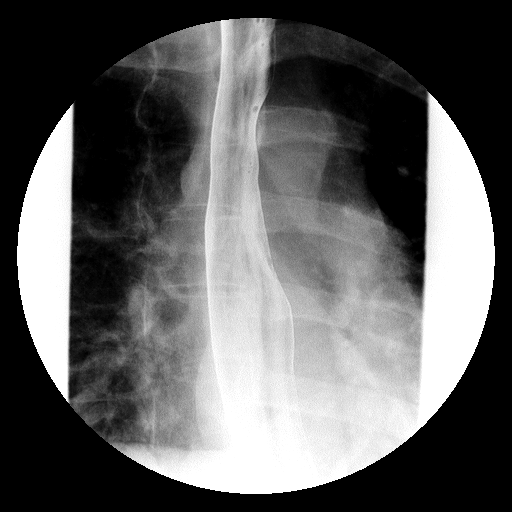

[Series 7: run · 1 of 1 slices shown (4 of 17)]
[im 1/1]
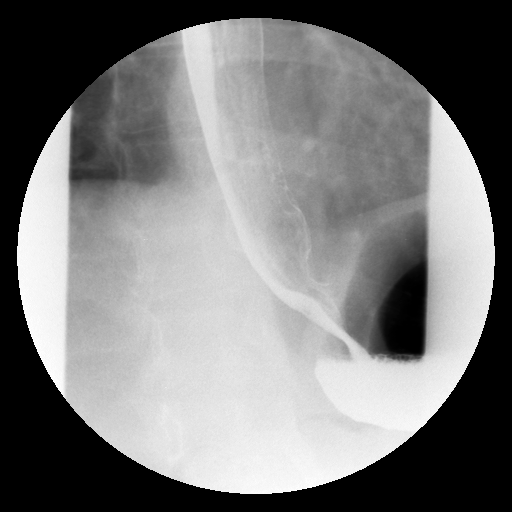

[Series 9: run · 1 of 1 slices shown (5 of 17)]
[im 1/1]
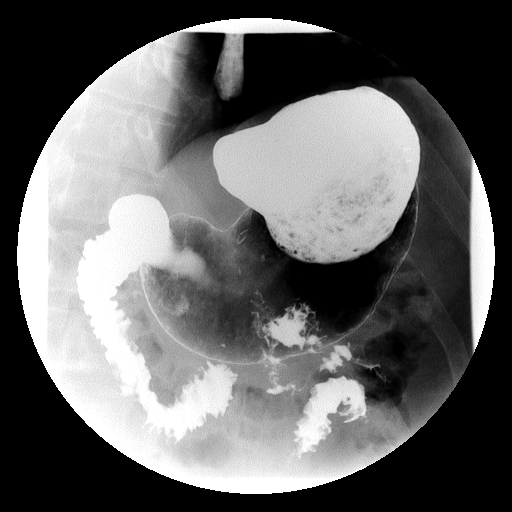

[Series 10: run · 1 of 1 slices shown (6 of 17)]
[im 1/1]
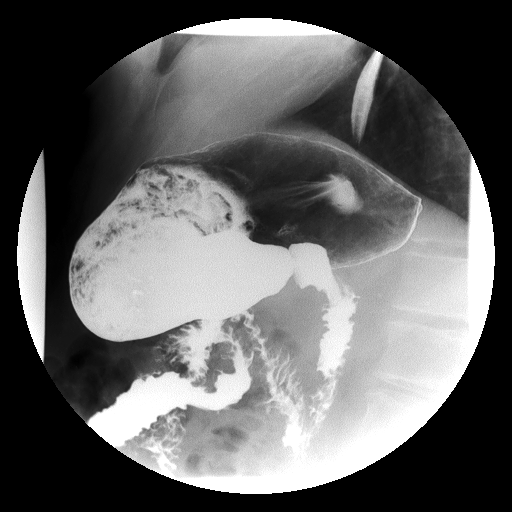

[Series 11: run · 1 of 1 slices shown (7 of 17)]
[im 1/1]
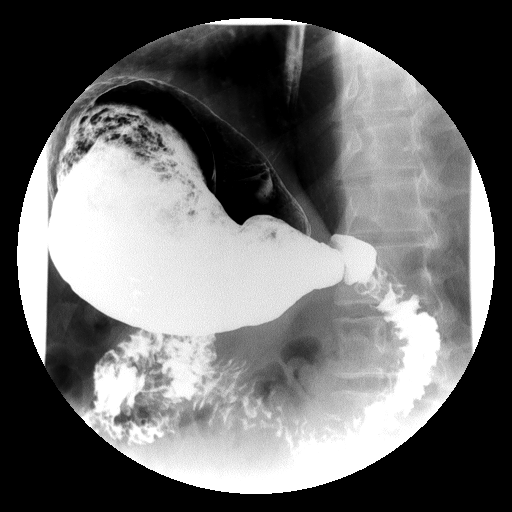

[Series 13: run · 1 of 1 slices shown (8 of 17)]
[im 1/1]
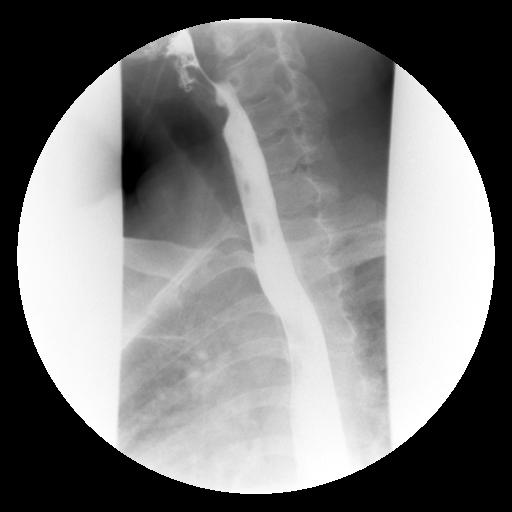

[Series 14: run · 1 of 1 slices shown (9 of 17)]
[im 1/1]
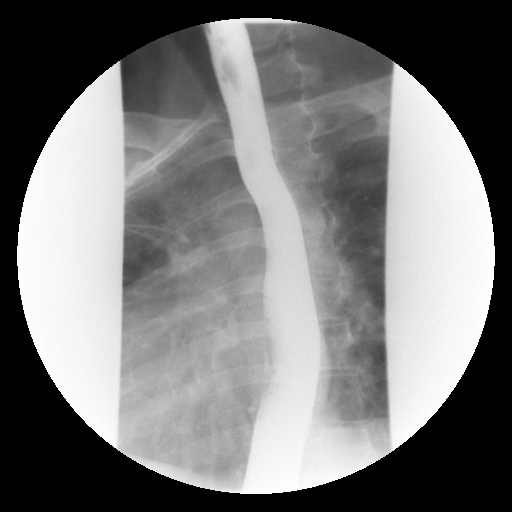

[Series 15: run · 1 of 1 slices shown (10 of 17)]
[im 1/1]
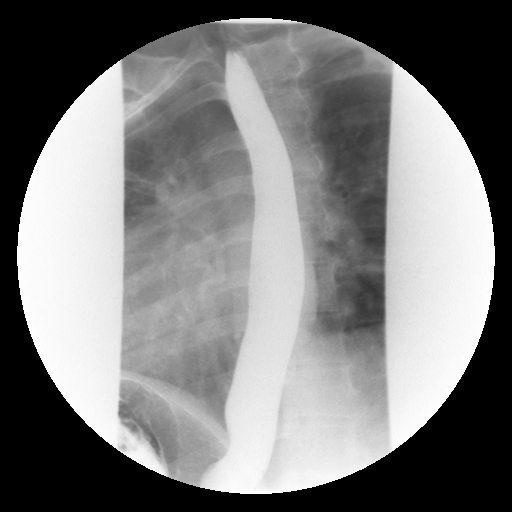

[Series 17: run · 1 of 1 slices shown (11 of 17)]
[im 1/1]
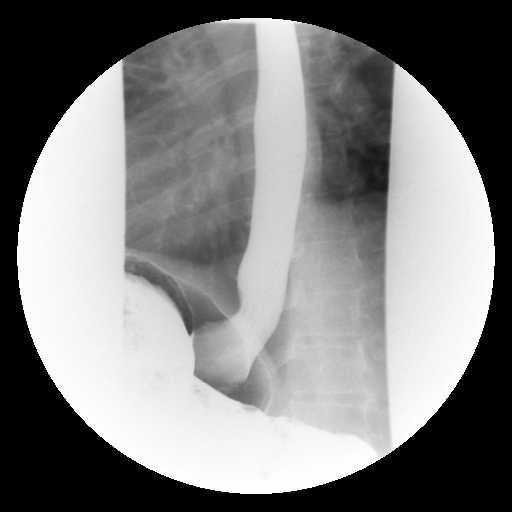

[Series 18: run · 1 of 1 slices shown (12 of 17)]
[im 1/1]
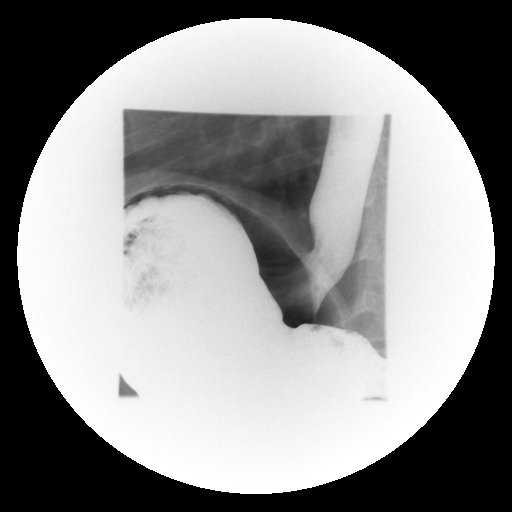

[Series 19: run · 1 of 1 slices shown (13 of 17)]
[im 1/1]
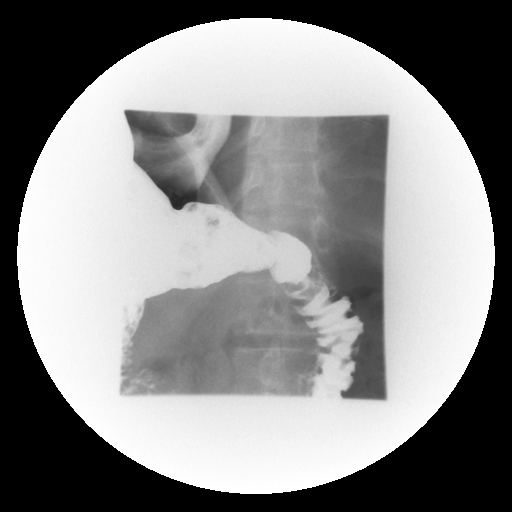

[Series 21: run · 1 of 1 slices shown (14 of 17)]
[im 1/1]
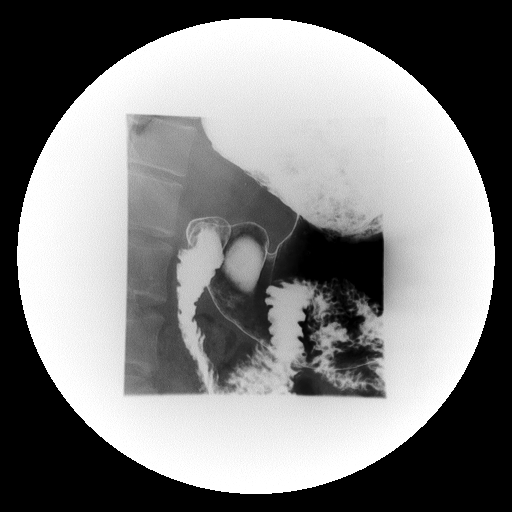

[Series 22: run · 1 of 1 slices shown (15 of 17)]
[im 1/1]
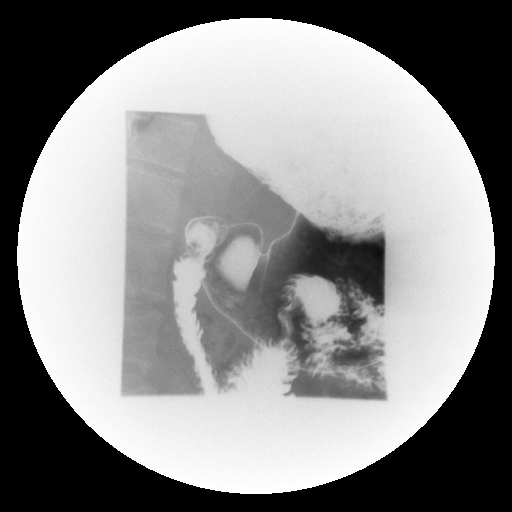

[Series 23: run · 1 of 1 slices shown (16 of 17)]
[im 1/1]
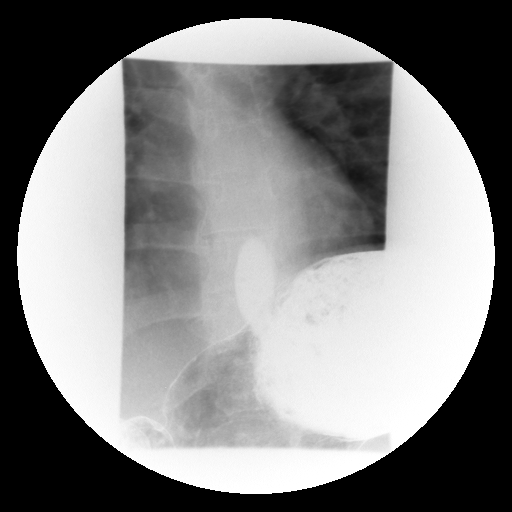

[Series 25: run · 1 of 1 slices shown (17 of 17)]
[im 1/1]
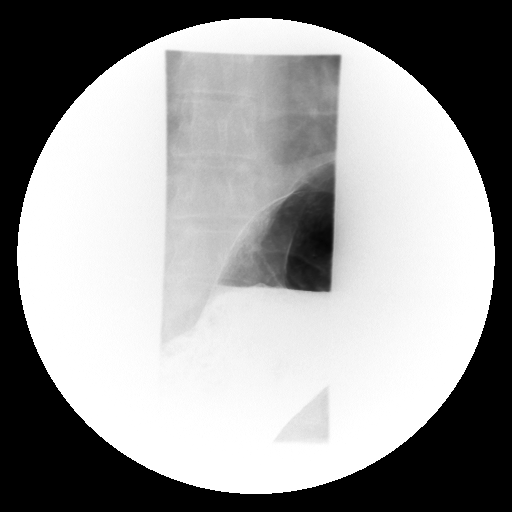

[Series 1001: view not recorded · 0.20mm/px · 1 of 1 slices shown]
[im 1/1]
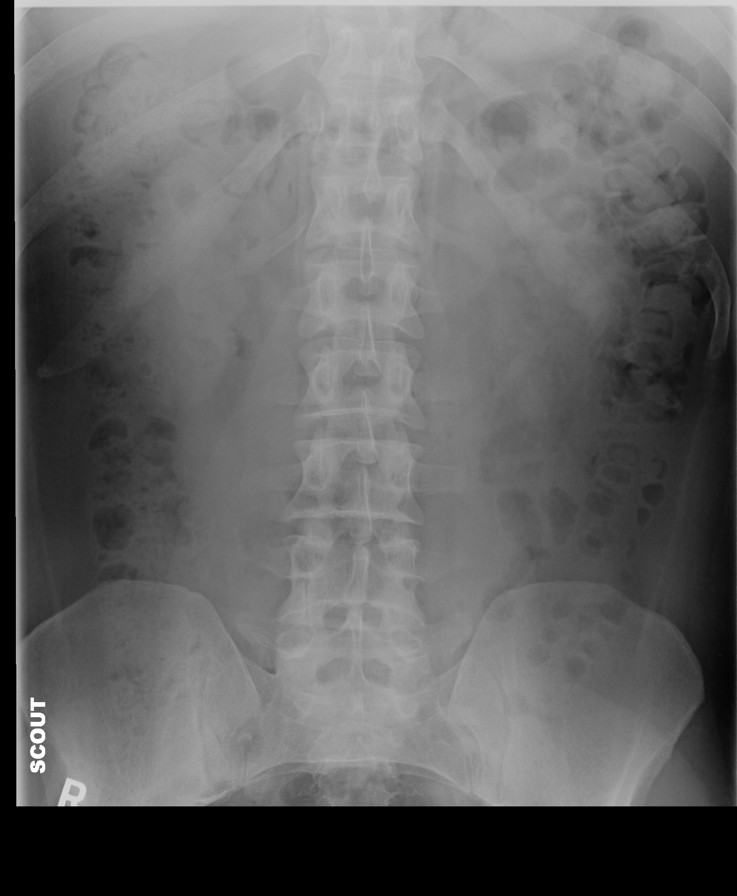

[18 of 24 positions shown; findings below may reference images not displayed]

FINDINGS: A preliminary film of the abdomen shows a moderate amount of feces
throughout the colon. No bowel obstruction is seen. No opaque
calculi are noted.

A double contrast upper GI was performed. The mucosa of the
esophagus is unremarkable. A single contrast study shows the
swallowing mechanism to be normal. No hiatal hernia is seen. However
mild to moderate gastroesophageal reflux is demonstrated. A barium
pill was given at the end of the study which passed into the stomach
without delay.

The stomach is normal in contour and peristalsis. There is some
retained food debris within the stomach making exclusion of small
polyps difficult. The duodenal bulb fills and the duodenal loop is
in normal position.
IMPRESSION: 1. Mild to moderate gastroesophageal reflux.  No hiatal hernia.
2. Barium pill passes into the stomach without delay.
3. Retained food debris within the stomach but no significant
abnormality is seen.

## 2017-02-26 ENCOUNTER — Emergency Department (HOSPITAL_COMMUNITY): Payer: 59

## 2017-02-26 ENCOUNTER — Encounter (HOSPITAL_COMMUNITY): Payer: Self-pay | Admitting: Emergency Medicine

## 2017-02-26 ENCOUNTER — Emergency Department (HOSPITAL_COMMUNITY)
Admission: EM | Admit: 2017-02-26 | Discharge: 2017-02-26 | Disposition: A | Discharge status: Home / Self Care | Payer: 59 | Attending: Emergency Medicine | Admitting: Emergency Medicine

## 2017-02-26 DIAGNOSIS — Z79899 Other long term (current) drug therapy: Secondary | ICD-10-CM | POA: Insufficient documentation

## 2017-02-26 DIAGNOSIS — R0789 Other chest pain: Secondary | ICD-10-CM | POA: Diagnosis not present

## 2017-02-26 DIAGNOSIS — R079 Chest pain, unspecified: Secondary | ICD-10-CM | POA: Diagnosis present

## 2017-02-26 LAB — CBC
HEMATOCRIT: 49.9 % (ref 39.0–52.0)
Hemoglobin: 17.8 g/dL — ABNORMAL HIGH (ref 13.0–17.0)
MCH: 28.8 pg (ref 26.0–34.0)
MCHC: 35.7 g/dL (ref 30.0–36.0)
MCV: 80.7 fL (ref 78.0–100.0)
PLATELETS: 188 10*3/uL (ref 150–400)
RBC: 6.18 MIL/uL — AB (ref 4.22–5.81)
RDW: 12.7 % (ref 11.5–15.5)
WBC: 14.2 10*3/uL — ABNORMAL HIGH (ref 4.0–10.5)

## 2017-02-26 LAB — BASIC METABOLIC PANEL
Anion gap: 11 (ref 5–15)
BUN: 9 mg/dL (ref 6–20)
CHLORIDE: 103 mmol/L (ref 101–111)
CO2: 23 mmol/L (ref 22–32)
Calcium: 10 mg/dL (ref 8.9–10.3)
Creatinine, Ser: 1.05 mg/dL (ref 0.61–1.24)
Glucose, Bld: 128 mg/dL — ABNORMAL HIGH (ref 65–99)
POTASSIUM: 3.4 mmol/L — AB (ref 3.5–5.1)
SODIUM: 137 mmol/L (ref 135–145)

## 2017-02-26 LAB — I-STAT TROPONIN, ED
TROPONIN I, POC: 0 ng/mL (ref 0.00–0.08)
Troponin i, poc: 0 ng/mL (ref 0.00–0.08)

## 2017-02-26 LAB — RAPID URINE DRUG SCREEN, HOSP PERFORMED
AMPHETAMINES: NOT DETECTED
BENZODIAZEPINES: NOT DETECTED
Barbiturates: NOT DETECTED
Cocaine: NOT DETECTED
OPIATES: NOT DETECTED
Tetrahydrocannabinol: NOT DETECTED

## 2017-02-26 LAB — D-DIMER, QUANTITATIVE: D-Dimer, Quant: <0.27 ug/mL-FEU (ref 0.00–0.50)

## 2017-02-26 MED ORDER — OMEPRAZOLE 20 MG PO CPDR: 20.0000 mg | DELAYED_RELEASE_CAPSULE | Freq: Every day | ORAL | 0 refills | Status: AC

## 2017-02-26 MED ORDER — GI COCKTAIL ~~LOC~~
30.0000 mL | Freq: Once | ORAL | Status: AC
  Administered 2017-02-26: 30 mL via ORAL
  Filled 2017-02-26: qty 30

## 2017-02-26 MED ORDER — LORAZEPAM 2 MG/ML IJ SOLN
0.5000 mg | Freq: Once | INTRAMUSCULAR | Status: AC
  Administered 2017-02-26: 0.5 mg via INTRAVENOUS
  Filled 2017-02-26: qty 1

## 2017-02-26 MED ORDER — SODIUM CHLORIDE 0.9 % IV BOLUS (SEPSIS)
1000.0000 mL | Freq: Once | INTRAVENOUS | Status: AC
  Administered 2017-02-26: 1000 mL via INTRAVENOUS

## 2017-02-26 NOTE — ED Provider Notes (Signed)
[facility]EM[facility]ENCY DEPT Provider Note   CSN: [id number] Arrival date & time: 02/26/17  0440     History   Chief Complaint Chief Complaint  Patient presents with  . Chest Pain    HPI Earl Foster is a 26 y.o. male.  HPI 26 year old Caucasian male with past medical history significant for GERD, migraines presents to the ER today with complaints of chest pain. Patient states that approximately 3 hours ago he developed a burning sensation in his chest that he thinks his bad acid reflux however it has never been this bad which is what prompted him to come to the ED for evaluation. Patient states the burning is left-sided and does not radiate. Not associated with diaphoresis. Does report some mild nausea but no emesis. He endorses mild shortness of breath which has since resolved. Patient appears anxious and states that this may be the likely cause of his chest pain. Has a history of anxiety was not taking that sort. Patient has not tried nothing for his symptoms prior to arrival. He denies any cardiac history. Denies any early family cardiac history. Patient denies any history of DVT/PE, prolonged immobilizations, recent hospitalizations/surgeries, tobacco use, lower extremity edema or calf tenderness. Pt denies any fever, chill, ha, vision changes, lightheadedness, dizziness, congestion, neck pain, cough, abd pain, v/d, urinary symptoms, change in bowel habits, melena, hematochezia, lower extremity paresthesias.  Past Medical History:  Diagnosis Date  . Migraine     Patient Active Problem List   Diagnosis Date Noted  . Migraine without aura and without status migrainosus, not intractable 06/07/2015  . Upper airway cough syndrome assoc with mscp L ant chest ? from coughing vs gerd related cp 05/31/2015  . Sinus tachycardia 05/18/2015  . Anxiety 05/18/2015  . Leukocytosis 05/18/2015  . Urinary frequency 05/18/2015  . GERD (gastroesophageal reflux disease) 05/18/2015  . Atypical chest pain  05/18/2015  . Lung bullae (HCC)   . Tachycardia     Past Surgical History:  Procedure Laterality Date  . MYRINGOTOMY    . TONSILLECTOMY         Home Medications    Prior to Admission medications   Medication Sig Start Date End Date Taking? Authorizing Provider  diazepam (VALIUM) 5 MG tablet Take 1 tablet (5 mg total) by mouth once. Take 15 to 30 minutes prior to MRI. 06/23/15   Drema Dallas, DO  Eletriptan Hydrobromide (RELPAX PO) Take 1 tablet by mouth as directed. As needed for HA    [provider]  FLUoxetine (PROZAC) 10 MG tablet Take 10 mg by mouth daily.    [provider]  LORazepam (ATIVAN) 1 MG tablet Take 0.5 tablets (0.5 mg total) by mouth 3 (three) times daily as needed for anxiety. 05/18/15   Barrett Henle, PA-C  naproxen sodium (ANAPROX) 550 MG tablet Take 1 tablet (550 mg total) by mouth 2 (two) times daily as needed. 06/07/15   Everlena Cooper, Adam R, DO  pantoprazole (PROTONIX) 20 MG tablet Take 2 tablets (40 mg total) by mouth daily. 05/18/15   Barrett Henle, PA-C    Family History Family History  Problem Relation Age of Onset  . Adopted: Yes    Social History Social History  Substance Use Topics  . Smoking status: Never Smoker  . Smokeless tobacco: Never Used  . Alcohol use No     Allergies   Patient has no known allergies.   Review of Systems Review of Systems  Constitutional: Negative for chills and fever.  HENT: Negative for congestion and sore throat.   Eyes: Negative for visual disturbance.  Respiratory: Positive for shortness of breath. Negative for cough.   Cardiovascular: Positive for chest pain and palpitations. Negative for leg swelling.  Gastrointestinal: Positive for nausea. Negative for abdominal pain, diarrhea and vomiting.  Genitourinary: Negative for dysuria, flank pain, frequency, hematuria, scrotal swelling, testicular pain and urgency.  Musculoskeletal: Negative for arthralgias and myalgias.    Skin: Negative for rash.  Neurological: Negative for dizziness, syncope, weakness, light-headedness, numbness and headaches.  Psychiatric/Behavioral: Negative for sleep disturbance. The patient is nervous/anxious.      Physical Exam Updated Vital Signs BP (!) 174/104   Pulse (!) 132   Temp 98.3 F (36.8 C) (Oral)   Resp 18   SpO2 99%   Physical Exam  Constitutional: He is oriented to person, place, and time. He appears well-developed and well-nourished.  Non-toxic appearance. No distress.  HENT:  Head: Normocephalic and atraumatic.  Nose: Nose normal.  Mouth/Throat: Oropharynx is clear and moist.  Eyes: Conjunctivae are normal. Pupils are equal, round, and reactive to light. Right eye exhibits no discharge. Left eye exhibits no discharge.  Neck: Normal range of motion. Neck supple. No JVD present. No tracheal deviation present.  Cardiovascular: Regular rhythm, normal heart sounds and intact distal pulses.  Tachycardia present.  Exam reveals no gallop and no friction rub.   No murmur heard. Pulmonary/Chest: Effort normal and breath sounds normal. No respiratory distress. He has no wheezes. He exhibits no tenderness.  No hypoxia or tachypnea.  Abdominal: Soft. Bowel sounds are normal. He exhibits no distension. There is no tenderness. There is no rebound and no guarding.  Musculoskeletal: Normal range of motion.  No lower extremity edema or calf tenderness.  Lymphadenopathy:    He has no cervical adenopathy.  Neurological: He is alert and oriented to person, place, and time.  Skin: Skin is warm and dry. Capillary refill takes less than 2 seconds. He is not diaphoretic.  Psychiatric: His behavior is normal. Judgment and thought content normal. His mood appears anxious.  Nursing note and vitals reviewed.    ED Treatments / Results  Labs (all labs ordered are listed, but only abnormal results are displayed) Labs Reviewed  BASIC METABOLIC PANEL - Abnormal; Notable for the  following:       Result Value   Potassium 3.4 (*)    Glucose, Bld 128 (*)    All other components within normal limits  CBC - Abnormal; Notable for the following:    WBC 14.2 (*)    RBC 6.18 (*)    Hemoglobin 17.8 (*)    All other components within normal limits  D-DIMER, QUANTITATIVE (NOT AT [facility])  RAPID URINE DRUG SCREEN, HOSP PERFORMED  I-STAT TROPOININ, ED  I-STAT TROPOININ, ED    EKG  EKG Interpretation None       Radiology Dg Chest 2 View  Result Date: 02/26/2017 CLINICAL DATA:  Left-sided chest pain tonight. EXAM: CHEST  2 VIEW COMPARISON:  Radiographs and CT 05/18/2015 FINDINGS: The cardiomediastinal contours are normal. The lungs are clear. Pulmonary vasculature is normal. No consolidation, pleural effusion, or pneumothorax. No acute osseous abnormalities are seen. IMPRESSION: No acute pulmonary process. Electronically Signed   By: Rubye Oaks M.D.   On: 02/26/2017 05:28    Procedures Procedures (including critical care time)  Medications Ordered in ED Medications  gi cocktail (Maalox,Lidocaine,Donnatal) (30 mLs Oral Given 02/26/17 0804)  sodium chloride 0.9 % bolus 1,000 mL (0 mLs  Intravenous Stopped 02/26/17 0735)  LORazepam (ATIVAN) injection 0.5 mg (0.5 mg Intravenous Given 02/26/17 0804)     Initial Impression / Assessment and Plan / ED Course  I have reviewed the triage vital signs and the nursing notes.  Pertinent labs & imaging results that were available during my care of the patient were reviewed by me and considered in my medical decision making (see chart for details).     Patient resents to the emergency department today with complaints of chest burning with mild associated shortness of breath. Patient also appears to be anxious in the room.   Chest pain is not likely of cardiac or pulmonary etiology d/t presentation, d-dimer negative, VSS, no tracheal deviation, no JVD or new murmur, RRR, breath sounds equal bilaterally, EKG without any change  from prior tracing and shows no signs of ischemia, tachycardia noted however history of same and appears to be due to anxiety as heart rate goes up when patient starts talking, negative delta troponin, and negative CXR. There is a mild leukocytosis of 14,000 however history of elevated white count. Patient has no infectious symptoms. He is afebrile. Hemoglobin elevated as well. This is likely due to hemoconcentration. Patient given a liter of fluid. Did reviewed note from prior visit in 2016 for chest pain or shortness of breath. At that time patient was tachycardic. Negative workup and discharged home. I encouraged patient to follow up with his primary care doctor His blood pressure rechecked and make sure that does not need to be on blood pressure medication. Patient states the symptoms have resolved after medications. Pt has been advised to return to the ED is CP becomes exertional, associated with diaphoresis or nausea, radiates to left jaw/arm, worsens or becomes concerning in any way. Pt appears reliable for follow up and is agreeable to discharge.       Final Clinical Impressions(s) / ED Diagnoses   Final diagnoses:  Atypical chest pain    New Prescriptions New Prescriptions   OMEPRAZOLE (PRILOSEC) 20 MG CAPSULE    Take 1 capsule (20 mg total) by mouth daily.     Rise MuLeaphart, Kenneth T, PA-C 02/26/17 [id number]    Benjiman CorePickering, Nathan, MD 02/26/17 1537

## 2017-02-26 NOTE — ED Triage Notes (Signed)
Patient here with chest pain, states that it is a burning sensation in the center of his chest.  He states that belching does help.  It started about 3 hours ago, mild shortness of breath, some nausea with is relieved at this time.  Last time he ate was at 2000 last night, mac and cheese.

## 2017-02-26 NOTE — Discharge Instructions (Signed)
All of your lab work and imaging has been reassuring. No signs of blood clots or heart attack. Please take the Prilosec once a day along with an over-the-counter Zantac or Pepcid for heartburn. May take MiraLAX for constipation and Dulcolax for stool softener. Make sure that he follow up with her primary care doctor concerning her symptoms today. Her blood pressure slightly elevated in the ED. Need to have this rechecked. If you develop worsening symptoms return to the ED immediately.

## 2017-03-07 ENCOUNTER — Emergency Department (HOSPITAL_COMMUNITY)
Admission: EM | Admit: 2017-03-07 | Discharge: 2017-03-07 | Disposition: A | Discharge status: Home / Self Care | Payer: 59 | Attending: Emergency Medicine | Admitting: Emergency Medicine

## 2017-03-07 ENCOUNTER — Other Ambulatory Visit: Payer: Self-pay

## 2017-03-07 ENCOUNTER — Encounter (HOSPITAL_COMMUNITY): Payer: Self-pay

## 2017-03-07 DIAGNOSIS — Z79899 Other long term (current) drug therapy: Secondary | ICD-10-CM | POA: Diagnosis not present

## 2017-03-07 DIAGNOSIS — R000 Tachycardia, unspecified: Secondary | ICD-10-CM | POA: Insufficient documentation

## 2017-03-07 DIAGNOSIS — F411 Generalized anxiety disorder: Secondary | ICD-10-CM | POA: Insufficient documentation

## 2017-03-07 DIAGNOSIS — R0602 Shortness of breath: Secondary | ICD-10-CM | POA: Diagnosis present

## 2017-03-07 LAB — I-STAT TROPONIN, ED: Troponin i, poc: 0.04 ng/mL (ref 0.00–0.08)

## 2017-03-07 MED ORDER — HYDROXYZINE HCL 25 MG PO TABS: 25.0000 mg | ORAL_TABLET | Freq: Three times a day (TID) | ORAL | 0 refills | Status: AC | PRN

## 2017-03-07 MED ORDER — HYDROXYZINE HCL 25 MG PO TABS
25.0000 mg | ORAL_TABLET | Freq: Once | ORAL | Status: AC
  Administered 2017-03-07: 25 mg via ORAL
  Filled 2017-03-07: qty 1

## 2017-03-07 MED ORDER — CALCIUM CARBONATE ANTACID 500 MG PO CHEW
2.0000 | CHEWABLE_TABLET | Freq: Once | ORAL | Status: AC
  Administered 2017-03-07: 400 mg via ORAL
  Filled 2017-03-07: qty 2

## 2017-03-07 NOTE — ED Triage Notes (Signed)
Pt presents with onset of shortness of breath that began x 1 hour ago.  Pt reports increased stress, reports having panic attacks in the past reports similar symptoms except today, pt reports he was hyperventilating with numbness to hands but denies at present with slow breathing.

## 2017-03-07 NOTE — ED Provider Notes (Signed)
[facility] Provider Note   CSN: [id number] Arrival date & time: 03/07/17  1117     History   Chief Complaint No chief complaint on file.   HPI Earl Foster is a 26 y.o. male.  26 year old male with past medical history including anxiety, GERD, migraines who presents with shortness of breath and numbness. The patient again having shortness of breath approximately one hour prior to arrival which feels similar to previous episodes of panic attack. He was hyperventilating and had nausea which were typical of his panic attack but he then began having numbness of his hands that eventually spread to his entire body. The symptoms have been coming and going depending on whether he is hyperventilating. He reports almost no chest pain that he describes as 0.25/10 in intensity. He also reports heartburn. He has been under a lot of stress recently. He was on anxiety medication but stopped taking it 6 months ago because he was doing well. He is not currently following with anyone for anxiety treatment.   The history is provided by the patient.    Past Medical History:  Diagnosis Date  . Migraine     Patient Active Problem List   Diagnosis Date Noted  . Migraine without aura and without status migrainosus, not intractable 06/07/2015  . Upper airway cough syndrome assoc with mscp L ant chest ? from coughing vs gerd related cp 05/31/2015  . Sinus tachycardia 05/18/2015  . Anxiety 05/18/2015  . Leukocytosis 05/18/2015  . Urinary frequency 05/18/2015  . GERD (gastroesophageal reflux disease) 05/18/2015  . Atypical chest pain 05/18/2015  . Lung bullae (HCC)   . Tachycardia     Past Surgical History:  Procedure Laterality Date  . MYRINGOTOMY    . TONSILLECTOMY         Home Medications    Prior to Admission medications   Medication Sig Start Date End Date Taking? Authorizing Provider  diazepam (VALIUM) 5 MG tablet Take 1 tablet (5 mg total) by mouth once. Take 15 to 30  minutes prior to MRI. 06/23/15   Drema Dallas, DO  Eletriptan Hydrobromide (RELPAX PO) Take 1 tablet by mouth as directed. As needed for HA    [provider]  FLUoxetine (PROZAC) 10 MG tablet Take 10 mg by mouth daily.    [provider]  hydrOXYzine (ATARAX/VISTARIL) 25 MG tablet Take 1 tablet (25 mg total) by mouth every 8 (eight) hours as needed for anxiety. 03/07/17   Pallavi Clifton, Ambrose Finland, MD  LORazepam (ATIVAN) 1 MG tablet Take 0.5 tablets (0.5 mg total) by mouth 3 (three) times daily as needed for anxiety. 05/18/15   Barrett Henle, PA-C  naproxen sodium (ANAPROX) 550 MG tablet Take 1 tablet (550 mg total) by mouth 2 (two) times daily as needed. 06/07/15   Drema Dallas, DO  omeprazole (PRILOSEC) 20 MG capsule Take 1 capsule (20 mg total) by mouth daily. 02/26/17   Rise Mu, PA-C  pantoprazole (PROTONIX) 20 MG tablet Take 2 tablets (40 mg total) by mouth daily. 05/18/15   Barrett Henle, PA-C    Family History Family History  Problem Relation Age of Onset  . Adopted: Yes    Social History Social History  Substance Use Topics  . Smoking status: Never Smoker  . Smokeless tobacco: Never Used  . Alcohol use No     Allergies   Patient has no known allergies.   Review of Systems Review of Systems All other systems reviewed and are  negative except that which was mentioned in HPI   Physical Exam Updated Vital Signs BP (!) 137/114   Pulse (!) 119   Temp 98.4 F (36.9 C) (Oral)   Resp (!) 22   Ht 5\' 9"  (1.753 m)   Wt 81.6 kg (180 lb)   SpO2 98%   BMI 26.58 kg/m   Physical Exam  Constitutional: He is oriented to person, place, and time. He appears well-developed and well-nourished. No distress.  HENT:  Head: Normocephalic and atraumatic.  Moist mucous membranes  Eyes: Conjunctivae are normal. Pupils are equal, round, and reactive to light.  Neck: Neck supple.  Cardiovascular: Regular rhythm and normal heart sounds.   Tachycardia present.   No murmur heard. Pulmonary/Chest: Effort normal and breath sounds normal.  Abdominal: Soft. Bowel sounds are normal. He exhibits no distension. There is no tenderness.  Musculoskeletal: He exhibits no edema.  Neurological: He is alert and oriented to person, place, and time.  Fluent speech  Skin: Skin is warm and dry.  Psychiatric:  Anxious, jittery  Nursing note and vitals reviewed.    ED Treatments / Results  Labs (all labs ordered are listed, but only abnormal results are displayed) Labs Reviewed  [facility] SensorI-STAT TROPOININ, ED    EKG  EKG Interpretation None       Radiology No results found.  Procedures Procedures (including critical care time)  Medications Ordered in ED Medications  hydrOXYzine (ATARAX/VISTARIL) tablet 25 mg (25 mg Oral Given 03/07/17 1326)  calcium carbonate (TUMS - dosed in mg elemental calcium) chewable tablet 400 mg of elemental calcium (400 mg of elemental calcium Oral Given 03/07/17 1326)     Initial Impression / Assessment and Plan / ED Course  I have reviewed the triage vital signs and the nursing notes.  Pertinent labs  that were available during my care of the patient were reviewed by me and considered in my medical decision making (see chart for details).    PT w/ SOB, Upper ventilation, body numbness, and anxiety. He reports history of panic attacks and anxiety and states that all this feels similar except for the numbness. He was tachycardic and mildly hypertensive on exam but afebrile and with reassuring EKG. He was concerned about his heart and therefore obtained troponin which was negative. He had a full cardiac workup 1 week ago for chest pain which included negative serial troponins. He admits that he feels like all of this is anxiety related, he just wants some medication for short-term relief. Gave the patient a dose of Atarax and short prescription for this medication to use as needed. I emphasized the importance of  seeing PCP for long-term management. Patient voiced understanding and was discharged in satisfactory condition.  Final Clinical Impressions(s) / ED Diagnoses   Final diagnoses:  Generalized anxiety disorder    New Prescriptions Discharge Medication List as of 03/07/2017  2:09 PM    START taking these medications   Details  hydrOXYzine (ATARAX/VISTARIL) 25 MG tablet Take 1 tablet (25 mg total) by mouth every 8 (eight) hours as needed for anxiety., Starting Fri 03/07/2017, Print         Ewa Hipp, Ambrose Finlandachel Morgan, MD 03/07/17 [phone]

## 2017-03-07 NOTE — ED Notes (Signed)
PT is in stable condition upon d/c and ambulates from ED. 

## 2017-03-15 ENCOUNTER — Emergency Department (HOSPITAL_COMMUNITY): Payer: 59

## 2017-03-15 ENCOUNTER — Emergency Department (HOSPITAL_COMMUNITY)
Admission: EM | Admit: 2017-03-15 | Discharge: 2017-03-15 | Disposition: A | Discharge status: Home / Self Care | Payer: 59 | Attending: Emergency Medicine | Admitting: Emergency Medicine

## 2017-03-15 ENCOUNTER — Encounter (HOSPITAL_COMMUNITY): Payer: Self-pay | Admitting: Emergency Medicine

## 2017-03-15 DIAGNOSIS — F419 Anxiety disorder, unspecified: Secondary | ICD-10-CM | POA: Insufficient documentation

## 2017-03-15 DIAGNOSIS — K219 Gastro-esophageal reflux disease without esophagitis: Secondary | ICD-10-CM | POA: Insufficient documentation

## 2017-03-15 DIAGNOSIS — R0789 Other chest pain: Secondary | ICD-10-CM | POA: Diagnosis not present

## 2017-03-15 DIAGNOSIS — Z79899 Other long term (current) drug therapy: Secondary | ICD-10-CM | POA: Insufficient documentation

## 2017-03-15 DIAGNOSIS — R079 Chest pain, unspecified: Secondary | ICD-10-CM | POA: Diagnosis present

## 2017-03-15 LAB — URINALYSIS, ROUTINE W REFLEX MICROSCOPIC
Bilirubin Urine: NEGATIVE
Glucose, UA: NEGATIVE mg/dL
Hgb urine dipstick: NEGATIVE
Ketones, ur: 20 mg/dL — AB
Leukocytes, UA: NEGATIVE
Nitrite: NEGATIVE
Protein, ur: NEGATIVE mg/dL
Specific Gravity, Urine: 1.006 (ref 1.005–1.030)
pH: 8 (ref 5.0–8.0)

## 2017-03-15 LAB — BASIC METABOLIC PANEL
Anion gap: 11 (ref 5–15)
BUN: 6 mg/dL (ref 6–20)
CO2: 21 mmol/L — ABNORMAL LOW (ref 22–32)
CREATININE: 1.01 mg/dL (ref 0.61–1.24)
Calcium: 9.7 mg/dL (ref 8.9–10.3)
Chloride: 107 mmol/L (ref 101–111)
Glucose, Bld: 109 mg/dL — ABNORMAL HIGH (ref 65–99)
POTASSIUM: 3.4 mmol/L — AB (ref 3.5–5.1)
SODIUM: 139 mmol/L (ref 135–145)

## 2017-03-15 LAB — HEPATIC FUNCTION PANEL
ALT: 46 U/L (ref 17–63)
AST: 25 U/L (ref 15–41)
Albumin: 4.8 g/dL (ref 3.5–5.0)
Alkaline Phosphatase: 60 U/L (ref 38–126)
Bilirubin, Direct: 0.2 mg/dL (ref 0.1–0.5)
Indirect Bilirubin: 0.8 mg/dL (ref 0.3–0.9)
Total Bilirubin: 1 mg/dL (ref 0.3–1.2)
Total Protein: 7.9 g/dL (ref 6.5–8.1)

## 2017-03-15 LAB — CBC
HCT: 46.4 % (ref 39.0–52.0)
Hemoglobin: 16.8 g/dL (ref 13.0–17.0)
MCH: 29.2 pg (ref 26.0–34.0)
MCHC: 36.2 g/dL — ABNORMAL HIGH (ref 30.0–36.0)
MCV: 80.6 fL (ref 78.0–100.0)
PLATELETS: 178 10*3/uL (ref 150–400)
RBC: 5.76 MIL/uL (ref 4.22–5.81)
RDW: 12.7 % (ref 11.5–15.5)
WBC: 12.1 10*3/uL — AB (ref 4.0–10.5)

## 2017-03-15 LAB — I-STAT TROPONIN, ED
Troponin i, poc: 0 ng/mL (ref 0.00–0.08)
Troponin i, poc: 0 ng/mL (ref 0.00–0.08)

## 2017-03-15 LAB — D-DIMER, QUANTITATIVE: D-Dimer, Quant: <0.27 ug/mL-FEU (ref 0.00–0.50)

## 2017-03-15 MED ORDER — ALUM & MAG HYDROXIDE-SIMETH 200-200-20 MG/5 ML NICU TOPICAL: 1.0000 "application " | Freq: Once | TOPICAL | Status: DC

## 2017-03-15 MED ORDER — FAMOTIDINE 20 MG PO TABS: 20.0000 mg | ORAL_TABLET | Freq: Two times a day (BID) | ORAL | 0 refills | Status: AC

## 2017-03-15 MED ORDER — ALUM & MAG HYDROXIDE-SIMETH 200-200-20 MG/5ML PO SUSP
30.0000 mL | Freq: Once | ORAL | Status: DC
  Filled 2017-03-15: qty 30

## 2017-03-15 MED ORDER — SODIUM CHLORIDE 0.9 % IV BOLUS (SEPSIS)
1000.0000 mL | Freq: Once | INTRAVENOUS | Status: AC
  Administered 2017-03-15: 1000 mL via INTRAVENOUS

## 2017-03-15 MED ORDER — FAMOTIDINE IN NACL 20-0.9 MG/50ML-% IV SOLN
20.0000 mg | Freq: Once | INTRAVENOUS | Status: AC
  Administered 2017-03-15: 20 mg via INTRAVENOUS
  Filled 2017-03-15: qty 50

## 2017-03-15 NOTE — ED Notes (Signed)
Patient transported to X-ray 

## 2017-03-15 NOTE — Discharge Instructions (Signed)
Medications: Pepcid  Treatment: Begin taking Pepcid twice daily as prescribed. Adhere to the diet outlined in the paperwork below.  Follow-up: Please follow-up with gastroenterology for further evaluation and treatment of your probable reflux symptoms. Please return to emergency department if you develop any new or worsening symptoms.

## 2017-03-15 NOTE — ED Triage Notes (Signed)
Pt c/o generalized chest pain and burning sensation in epigastric area onset today. Pt has history of GERD and tried his medications without relief.

## 2017-03-15 NOTE — ED Provider Notes (Signed)
[facility]EMER[facility]ENCY DEPT Provider Note   CSN: [id number] Arrival date & time: 03/15/17  1707     History   Chief Complaint Chief Complaint  Patient presents with  . Chest Pain  . Abdominal Pain  . Gastroesophageal Reflux    HPI Earl Foster is a 26 y.o. male with history of anxiety, GERD who presents with a 12 hour history of chest pain and abdominal pain. Patient reports a constant epigastric and left-sided chest pain with intermittent worsening of chest tightness and burning. He states it feels similar to his past GERD symptoms, but is worse and is lasting longer. He has taken his omeprazole without relief. He denies any difficulty breathing. Patient reports his symptoms began shortly after eating this morning. His symptoms are worse with laying down and walking around. They are improved with leaning forward. Patient reports that he was diagnosed with GERD on 02/26/17. He was initiated on omeprazole and his symptoms had been under control for the past month. The patient reports he has chronic anxiety and always feels anxious. He reports intermittent diarrhea and constipation. He reports nausea at times, but no vomiting. She is  HPI  Past Medical History:  Diagnosis Date  . Migraine     Patient Active Problem List   Diagnosis Date Noted  . Migraine without aura and without status migrainosus, not intractable 06/07/2015  . Upper airway cough syndrome assoc with mscp L ant chest ? from coughing vs gerd related cp 05/31/2015  . Sinus tachycardia 05/18/2015  . Anxiety 05/18/2015  . Leukocytosis 05/18/2015  . Urinary frequency 05/18/2015  . GERD (gastroesophageal reflux disease) 05/18/2015  . Atypical chest pain 05/18/2015  . Lung bullae (HCC)   . Tachycardia     Past Surgical History:  Procedure Laterality Date  . MYRINGOTOMY    . TONSILLECTOMY         Home Medications    Prior to Admission medications   Medication Sig Start Date End Date Taking? Authorizing Provider    calcium carbonate (TUMS - DOSED IN MG ELEMENTAL CALCIUM) 500 MG chewable tablet Chew 2 tablets by mouth 3 (three) times daily as needed for indigestion or heartburn.   Yes [provider]  FLUoxetine (PROZAC) 10 MG tablet Take 10 mg by mouth daily.   Yes [provider]  hydrOXYzine (ATARAX/VISTARIL) 25 MG tablet Take 1 tablet (25 mg total) by mouth every 8 (eight) hours as needed for anxiety. 03/07/17  Yes Little, Ambrose Finland, MD  ibuprofen (ADVIL,MOTRIN) 200 MG tablet Take 200 mg by mouth every 6 (six) hours as needed for moderate pain.   Yes [provider]  omeprazole (PRILOSEC) 20 MG capsule Take 1 capsule (20 mg total) by mouth daily. 02/26/17  Yes Leaphart, Lynann Beaver, PA-C  Eletriptan Hydrobromide (RELPAX PO) Take 1 tablet by mouth as directed. As needed for HA    [provider]  famotidine (PEPCID) 20 MG tablet Take 1 tablet (20 mg total) by mouth 2 (two) times daily. 03/15/17   Derrick Orris, Waylan Boga, PA-C  naproxen sodium (ANAPROX) 550 MG tablet Take 1 tablet (550 mg total) by mouth 2 (two) times daily as needed. 06/07/15   Drema Dallas, DO    Family History Family History  Problem Relation Age of Onset  . Adopted: Yes    Social History Social History  Substance Use Topics  . Smoking status: Never Smoker  . Smokeless tobacco: Never Used  . Alcohol use No     Allergies   Patient  has no known allergies.   Review of Systems Review of Systems  Constitutional: Negative for chills and fever.  HENT: Positive for sore throat. Negative for facial swelling.   Respiratory: Negative for shortness of breath.   Cardiovascular: Positive for chest pain.  Gastrointestinal: Positive for abdominal pain and nausea. Negative for vomiting.  Genitourinary: Negative for dysuria.  Musculoskeletal: Negative for back pain.  Skin: Negative for rash and wound.  Neurological: Negative for headaches.  Psychiatric/Behavioral: The patient is nervous/anxious.       Physical Exam Updated Vital Signs BP (!) 149/82   Pulse 87   Temp 98.1 F (36.7 C) (Oral)   Resp 14   Ht 5\' 9"  (1.753 m)   Wt 81.6 kg (180 lb)   SpO2 100%   BMI 26.58 kg/m   Physical Exam  Constitutional: He appears well-developed and well-nourished. No distress.  HENT:  Head: Normocephalic and atraumatic.  Mouth/Throat: Oropharynx is clear and moist. No oropharyngeal exudate.  Eyes: Conjunctivae are normal. Pupils are equal, round, and reactive to light. Right eye exhibits no discharge. Left eye exhibits no discharge. No scleral icterus.  Neck: Normal range of motion. Neck supple. No thyromegaly present.  Cardiovascular: Regular rhythm, normal heart sounds and intact distal pulses.  Tachycardia present.  Exam reveals no gallop and no friction rub.   No murmur heard. Pulmonary/Chest: Effort normal and breath sounds normal. No stridor. No respiratory distress. He has no wheezes. He has no rales. He exhibits no tenderness.  Abdominal: Soft. Bowel sounds are normal. He exhibits no distension. There is no tenderness. There is no rebound and no guarding.  Musculoskeletal: He exhibits no edema.  Lymphadenopathy:    He has no cervical adenopathy.  Neurological: He is alert. Coordination normal.  Skin: Skin is warm and dry. No rash noted. He is not diaphoretic. No pallor.  Psychiatric: He has a normal mood and affect.  Nursing note and vitals reviewed.    ED Treatments / Results  Labs (all labs ordered are listed, but only abnormal results are displayed) Labs Reviewed  BASIC METABOLIC PANEL - Abnormal; Notable for the following:       Result Value   Potassium 3.4 (*)    CO2 21 (*)    Glucose, Bld 109 (*)    All other components within normal limits  CBC - Abnormal; Notable for the following:    WBC 12.1 (*)    MCHC 36.2 (*)    All other components within normal limits  URINALYSIS, ROUTINE W REFLEX MICROSCOPIC - Abnormal; Notable for the following:    Color, Urine  STRAW (*)    Ketones, ur 20 (*)    All other components within normal limits  D-DIMER, QUANTITATIVE (NOT [facility])  HEPATIC FUNCTION PANEL  I-STAT TROPOININ, ED  I-STAT TROPOININ, ED    EKG  EKG Interpretation  Date/Time:  Saturday March 15 2017 22:52:45 EDT Ventricular Rate:  88 PR Interval:  170 QRS Duration: 92 QT Interval:  366 QTC Calculation: 443 R Axis:   81 Text Interpretation:  Sinus rhythm Borderline T wave abnormalities T wave inversions on previous tracing have improved Confirmed by Frederick PeersLittle, Rachel [phone]([id number]) on 03/15/2017 10:59:19 PM       Radiology Dg Chest 2 View  Result Date: 03/15/2017 CLINICAL DATA:  Acute onset of central and left-sided chest tightness. Initial encounter. EXAM: CHEST  2 VIEW COMPARISON:  Chest radiograph performed 02/26/2017 FINDINGS: The lungs are well-aerated and clear. There is no evidence of focal opacification,  pleural effusion or pneumothorax. The heart is normal in size; the mediastinal contour is within normal limits. No acute osseous abnormalities are seen. IMPRESSION: No acute cardiopulmonary process seen. Electronically Signed   By: Roanna Raider M.D.   On: 03/15/2017 21:52    Procedures Procedures (including critical care time)  Medications Ordered in ED Medications  alum & mag hydroxide-simeth (MAALOX/MYLANTA) 200-200-20 MG/5ML suspension 30 mL (30 mLs Oral Refused 03/15/17 2125)  sodium chloride 0.9 % bolus 1,000 mL (0 mLs Intravenous Stopped 03/15/17 2255)  famotidine (PEPCID) IVPB 20 mg premix (0 mg Intravenous Stopped 03/15/17 2200)     Initial Impression / Assessment and Plan / ED Course  I have reviewed the triage vital signs and the nursing notes.  Pertinent labs & imaging results that were available during my care of the patient were reviewed by me and considered in my medical decision making (see chart for details).     Patient with chest pain and abdominal pain since 10 AM this morning. Patient relates to his ongoing GERD  symptoms, however it is unresolved pain medication. After IV Pepcid and 1 L of NS in the ED, patient states his symptoms are much improved. Patient also offered Maalox, however he declined. CBC shows WBC 12.1, which I feel is nonspecific. BMP shows potassium 3.4, CO2 21, glucose 109. Hepatic function panel within normal limits, delta troponin negative. D-dimer < 0.27. UA shows 20 ketones. CXR shows no acute cardiopulmonary disease. Initial EKG with tachycardia showed T wave abnormalities. Repeat EKG shows NSR, normal rate, borderline T wave abnormality. Considering patient's symptoms improved after Pepcid, feel patient's symptoms are related to GERD. Patient is also very anxious at baseline, which probably contributed to initial tachycardia. Will initiate Pepcid in addition to omeprazole with follow-up to GI for further evaluation. Strict return precautions discussed. Patient understands and agrees with plan. Patient vitals stable and discharged in satisfactory condition.  Final Clinical Impressions(s) / ED Diagnoses   Final diagnoses:  Atypical chest pain  Gastroesophageal reflux disease, esophagitis presence not specified    New Prescriptions New Prescriptions   FAMOTIDINE (PEPCID) 20 MG TABLET    Take 1 tablet (20 mg total) by mouth 2 (two) times daily.     Emi Holes, PA-C 03/15/17 2304    Clarene Duke Ambrose Finland, MD 03/16/17 1556

## 2017-03-18 ENCOUNTER — Emergency Department (HOSPITAL_COMMUNITY)
Admission: EM | Admit: 2017-03-18 | Discharge: 2017-03-18 | Disposition: A | Discharge status: Home / Self Care | Payer: 59 | Attending: Emergency Medicine | Admitting: Emergency Medicine

## 2017-03-18 ENCOUNTER — Encounter (HOSPITAL_COMMUNITY): Payer: Self-pay | Admitting: Emergency Medicine

## 2017-03-18 DIAGNOSIS — F419 Anxiety disorder, unspecified: Secondary | ICD-10-CM | POA: Insufficient documentation

## 2017-03-18 DIAGNOSIS — F410 Panic disorder [episodic paroxysmal anxiety] without agoraphobia: Secondary | ICD-10-CM | POA: Diagnosis present

## 2017-03-18 DIAGNOSIS — Z79899 Other long term (current) drug therapy: Secondary | ICD-10-CM | POA: Diagnosis not present

## 2017-03-18 NOTE — ED Triage Notes (Signed)
Pt. Stated, I had a panic attack while I was driving, I do that sometimes when Im in the car thinking by myself. I started having some tingling, chest discomfort, I was numb from head to toe. All have gone away.

## 2017-03-18 NOTE — ED Provider Notes (Signed)
[facility] Provider Note   CSN: [id number] Arrival date & time: 03/18/17  0746     History   Chief Complaint Chief Complaint  Patient presents with  . Panic Attack    HPI Earl Foster is a 26 y.o. male.  26 year old male with well-documented history of panic attacks presents after becoming severely anxious while driving his car today. Patient risks he started on Prozac and takes Vistaril when necessary. Denies any anginal qualities to these symptoms. Began hyperventilating have some perioral numbness which is slowly subsided. Does admit to increased stress recently. Denies any suicidal or homicidal ideations. States he's been compliant with his medication. Symptoms have resolved slowly without treatment prior to arrival.      Past Medical History:  Diagnosis Date  . Migraine     Patient Active Problem List   Diagnosis Date Noted  . Migraine without aura and without status migrainosus, not intractable 06/07/2015  . Upper airway cough syndrome assoc with mscp L ant chest ? from coughing vs gerd related cp 05/31/2015  . Sinus tachycardia 05/18/2015  . Anxiety 05/18/2015  . Leukocytosis 05/18/2015  . Urinary frequency 05/18/2015  . GERD (gastroesophageal reflux disease) 05/18/2015  . Atypical chest pain 05/18/2015  . Lung bullae (HCC)   . Tachycardia     Past Surgical History:  Procedure Laterality Date  . MYRINGOTOMY    . TONSILLECTOMY         Home Medications    Prior to Admission medications   Medication Sig Start Date End Date Taking? Authorizing Provider  calcium carbonate (TUMS - DOSED IN MG ELEMENTAL CALCIUM) 500 MG chewable tablet Chew 2 tablets by mouth 3 (three) times daily as needed for indigestion or heartburn.    [provider]  Eletriptan Hydrobromide (RELPAX PO) Take 1 tablet by mouth as directed. As needed for HA    [provider]  famotidine (PEPCID) 20 MG tablet Take 1 tablet (20 mg total) by mouth 2 (two) times  daily. 03/15/17   Law, Waylan BogaAlexandra M, PA-C  FLUoxetine (PROZAC) 10 MG tablet Take 10 mg by mouth daily.    [provider]  hydrOXYzine (ATARAX/VISTARIL) 25 MG tablet Take 1 tablet (25 mg total) by mouth every 8 (eight) hours as needed for anxiety. 03/07/17   Little, Ambrose Finlandachel Morgan, MD  ibuprofen (ADVIL,MOTRIN) 200 MG tablet Take 200 mg by mouth every 6 (six) hours as needed for moderate pain.    [provider]  naproxen sodium (ANAPROX) 550 MG tablet Take 1 tablet (550 mg total) by mouth 2 (two) times daily as needed. 06/07/15   Drema DallasJaffe, Adam R, DO  omeprazole (PRILOSEC) 20 MG capsule Take 1 capsule (20 mg total) by mouth daily. 02/26/17   Rise MuLeaphart, Kenneth T, PA-C    Family History Family History  Problem Relation Age of Onset  . Adopted: Yes    Social History Social History  Substance Use Topics  . Smoking status: Never Smoker  . Smokeless tobacco: Never Used  . Alcohol use No     Allergies   Patient has no known allergies.   Review of Systems Review of Systems  All other systems reviewed and are negative.    Physical Exam Updated Vital Signs BP (!) 145/101   Pulse 95   Temp 98.1 F (36.7 C) (Oral)   Resp 18   Ht 1.753 m (5\' 9" )   Wt 81.6 kg (180 lb)   SpO2 98%   BMI 26.58 kg/m   Physical Exam  Constitutional: He is oriented to person, place, and time. He appears well-developed and well-nourished.  Non-toxic appearance. No distress.  HENT:  Head: Normocephalic and atraumatic.  Eyes: Conjunctivae, EOM and lids are normal. Pupils are equal, round, and reactive to light.  Neck: Normal range of motion. Neck supple. No tracheal deviation present. No thyroid mass present.  Cardiovascular: Normal rate, regular rhythm and normal heart sounds.  Exam reveals no gallop.   No murmur heard. Pulmonary/Chest: Effort normal and breath sounds normal. No stridor. No respiratory distress. He has no decreased breath sounds. He has no wheezes. He has no rhonchi. He has no  rales.  Abdominal: Soft. Normal appearance and bowel sounds are normal. He exhibits no distension. There is no tenderness. There is no rebound and no CVA tenderness.  Musculoskeletal: Normal range of motion. He exhibits no edema or tenderness.  Neurological: He is alert and oriented to person, place, and time. He has normal strength. No cranial nerve deficit or sensory deficit. GCS eye subscore is 4. GCS verbal subscore is 5. GCS motor subscore is 6.  Skin: Skin is warm and dry. No abrasion and no rash noted.  Psychiatric: His speech is normal and behavior is normal. His mood appears anxious.  Nursing note and vitals reviewed.    ED Treatments / Results  Labs (all labs ordered are listed, but only abnormal results are displayed) Labs Reviewed - No data to display  EKG  EKG Interpretation  Date/Time:  Tuesday March 18 2017 08:02:16 EDT Ventricular Rate:  106 PR Interval:  150 QRS Duration: 94 QT Interval:  332 QTC Calculation: 441 R Axis:   86 Text Interpretation:  Sinus tachycardia Otherwise normal ECG Confirmed by Lorre Nick ([id number]) on 03/18/2017 8:40:43 AM       Radiology No results found.  Procedures Procedures (including critical care time)  Medications Ordered in ED Medications - No data to display   Initial Impression / Assessment and Plan / ED Course  I have reviewed the triage vital signs and the nursing notes.  Pertinent labs & imaging results that were available during my care of the patient were reviewed by me and considered in my medical decision making (see chart for details).     Patient encouraged to take his home medications and follow-up with his doctor.  Final Clinical Impressions(s) / ED Diagnoses   Final diagnoses:  None    New Prescriptions New Prescriptions   No medications on file     Lorre Nick, MD 03/18/17 [phone]

## 2017-03-18 NOTE — ED Notes (Signed)
ED Provider at bedside. 

## 2017-03-19 ENCOUNTER — Emergency Department (HOSPITAL_COMMUNITY): Payer: 59

## 2017-03-19 ENCOUNTER — Emergency Department (HOSPITAL_COMMUNITY)
Admission: EM | Admit: 2017-03-19 | Discharge: 2017-03-19 | Disposition: A | Discharge status: Home / Self Care | Payer: 59 | Attending: Emergency Medicine | Admitting: Emergency Medicine

## 2017-03-19 ENCOUNTER — Other Ambulatory Visit: Payer: Self-pay

## 2017-03-19 DIAGNOSIS — R42 Dizziness and giddiness: Secondary | ICD-10-CM | POA: Diagnosis not present

## 2017-03-19 DIAGNOSIS — R0789 Other chest pain: Secondary | ICD-10-CM

## 2017-03-19 DIAGNOSIS — Z79899 Other long term (current) drug therapy: Secondary | ICD-10-CM | POA: Diagnosis not present

## 2017-03-19 LAB — CBC
HCT: 48.2 % (ref 39.0–52.0)
HEMOGLOBIN: 17.5 g/dL — AB (ref 13.0–17.0)
MCH: 28.7 pg (ref 26.0–34.0)
MCHC: 36.3 g/dL — ABNORMAL HIGH (ref 30.0–36.0)
MCV: 79 fL (ref 78.0–100.0)
Platelets: 185 10*3/uL (ref 150–400)
RBC: 6.1 MIL/uL — AB (ref 4.22–5.81)
RDW: 12.6 % (ref 11.5–15.5)
WBC: 8.2 10*3/uL (ref 4.0–10.5)

## 2017-03-19 LAB — BASIC METABOLIC PANEL
Anion gap: 12 (ref 5–15)
BUN: 12 mg/dL (ref 6–20)
CHLORIDE: 105 mmol/L (ref 101–111)
CO2: 19 mmol/L — ABNORMAL LOW (ref 22–32)
CREATININE: 1.13 mg/dL (ref 0.61–1.24)
Calcium: 9.8 mg/dL (ref 8.9–10.3)
GFR calc Af Amer: >60 mL/min (ref >60)
GLUCOSE: 92 mg/dL (ref 65–99)
POTASSIUM: 3.3 mmol/L — AB (ref 3.5–5.1)
SODIUM: 136 mmol/L (ref 135–145)

## 2017-03-19 LAB — I-STAT TROPONIN, ED: TROPONIN I, POC: 0 ng/mL (ref 0.00–0.08)

## 2017-03-19 MED ORDER — GI COCKTAIL ~~LOC~~
30.0000 mL | Freq: Once | ORAL | Status: AC
  Administered 2017-03-19: 30 mL via ORAL
  Filled 2017-03-19: qty 30

## 2017-03-19 MED ORDER — METHYLPREDNISOLONE 4 MG PO TBPK: ORAL_TABLET | ORAL | 0 refills | Status: AC

## 2017-03-19 MED ORDER — CYCLOBENZAPRINE HCL 10 MG PO TABS: 10.0000 mg | ORAL_TABLET | Freq: Two times a day (BID) | ORAL | 0 refills | Status: AC | PRN

## 2017-03-19 NOTE — Discharge Instructions (Signed)
Eating take 800 mg of ibuprofen with food and Flexeril up to twice a day to help with muscle spasms. Please do not drive or work after taking Flexeril because it can make you sleepy. Please take 6 tabs day 1, 5 tabs day 2, 4 tabs day 3, 3 tabs day 4, 2 tabs day 5, and 1 on day 6 of methylprednisolone for your dizziness. If symptoms do not improve, you can follow-up with ENT. If he develop new or worsening symptoms, please return to the emergency department for reevaluation. Please keep your follow-up appointment with psychiatry at the end of the month.

## 2017-03-19 NOTE — ED Triage Notes (Addendum)
Cp that started this am and receced some AND NOW DIZZY that has been getting worse , did not say anything yesterday  No n /v , has had tingling in limbs not relat ed to anxiety. States it makes it diffcult to drive,  Also has popping and creaking in neck, and weakness in hands that comes and goes

## 2017-03-19 NOTE — ED Provider Notes (Signed)
[facility]-EMERGENCY DEPT Provider Note   CSN: [id number] Arrival date & time: 03/19/17  1230     History   Chief Complaint Chief Complaint  Patient presents with  . Chest Pain    HPI Earl Foster is a 26 y.o. male with a h/o of anxiety and GERD who presents to the emergency department with multiple complaints. He presents with sharp, worsening, mid-sternal CP that began this AM. He reports he has been helping a friend move for the last few days and is not normally active. No N/V, diaphoresis, fever, chills, or dyspnea. He has taken omeprazole without relief. He reports the pain is worse with movement of his extremities. No alleviating factors.   He also complains of dizziness that started yesterday and was initially intermittent, but has become constant today with associated tinnitus. No h/o similar. No hearing loss. No otalgia or otorrhea. He reports he has felt off balance most of the day.  The history is provided by the patient. No language interpreter was used.    Past Medical History:  Diagnosis Date  . Migraine     Patient Active Problem List   Diagnosis Date Noted  . Migraine without aura and without status migrainosus, not intractable 06/07/2015  . Upper airway cough syndrome assoc with mscp L ant chest ? from coughing vs gerd related cp 05/31/2015  . Sinus tachycardia 05/18/2015  . Anxiety 05/18/2015  . Leukocytosis 05/18/2015  . Urinary frequency 05/18/2015  . GERD (gastroesophageal reflux disease) 05/18/2015  . Atypical chest pain 05/18/2015  . Lung bullae (HCC)   . Tachycardia     Past Surgical History:  Procedure Laterality Date  . MYRINGOTOMY    . TONSILLECTOMY       Home Medications    Prior to Admission medications   Medication Sig Start Date End Date Taking? Authorizing Provider  calcium carbonate (TUMS - DOSED IN MG ELEMENTAL CALCIUM) 500 MG chewable tablet Chew 2 tablets by mouth 3 (three) times daily as needed for indigestion or heartburn.     [provider]  cyclobenzaprine (FLEXERIL) 10 MG tablet Take 1 tablet (10 mg total) by mouth 2 (two) times daily as needed for muscle spasms. 03/19/17   Nichola Warren A, PA-C  Eletriptan Hydrobromide (RELPAX PO) Take 1 tablet by mouth as directed. As needed for HA    [provider]  famotidine (PEPCID) 20 MG tablet Take 1 tablet (20 mg total) by mouth 2 (two) times daily. 03/15/17   Law, Waylan BogaAlexandra M, PA-C  FLUoxetine (PROZAC) 10 MG tablet Take 10 mg by mouth daily.    [provider]  hydrOXYzine (ATARAX/VISTARIL) 25 MG tablet Take 1 tablet (25 mg total) by mouth every 8 (eight) hours as needed for anxiety. 03/07/17   Little, Ambrose Finlandachel Morgan, MD  ibuprofen (ADVIL,MOTRIN) 200 MG tablet Take 200 mg by mouth every 6 (six) hours as needed for moderate pain.    [provider]  methylPREDNISolone (MEDROL DOSEPAK) 4 MG TBPK tablet Take 6 tabs day 1, 5 tabs day 2, 4 tabs day 3, 3 tabs day 4, 2 tabs day 5, and 1 on day 6 03/19/17   Kyrah Schiro A, PA-C  naproxen sodium (ANAPROX) 550 MG tablet Take 1 tablet (550 mg total) by mouth 2 (two) times daily as needed. 06/07/15   Drema DallasJaffe, Adam R, DO  omeprazole (PRILOSEC) 20 MG capsule Take 1 capsule (20 mg total) by mouth daily. 02/26/17   Rise MuLeaphart, Kenneth T, PA-C    Family History Family History  Problem Relation Age of Onset  . Adopted: Yes    Social History Social History  Substance Use Topics  . Smoking status: Never Smoker  . Smokeless tobacco: Never Used  . Alcohol use No     Allergies   Patient has no known allergies.   Review of Systems Review of Systems  Constitutional: Negative for activity change, chills and fever.  HENT: Positive for tinnitus. Negative for ear discharge and ear pain.   Respiratory: Negative for shortness of breath.   Cardiovascular: Positive for chest pain.  Gastrointestinal: Negative for abdominal pain, diarrhea, nausea and vomiting.  Musculoskeletal: Positive for gait problem. Negative  for arthralgias, back pain and myalgias.  Skin: Negative for rash.  Neurological: Positive for dizziness. Negative for syncope, weakness and numbness.  Psychiatric/Behavioral: The patient is nervous/anxious.      Physical Exam Updated Vital Signs BP 131/83   Pulse 85   Temp 98.1 F (36.7 C) (Oral)   Resp 20   Ht 5\' 10"  (1.778 m)   Wt 83 kg (183 lb)   SpO2 95%   BMI 26.26 kg/m   Physical Exam  Constitutional: He appears well-developed.  HENT:  Head: Normocephalic.  Right Ear: External ear normal.  Left Ear: External ear normal.  Eyes: Pupils are equal, round, and reactive to light. Conjunctivae are normal. Right eye exhibits nystagmus.  Neck: Neck supple.  Cardiovascular: Normal rate, regular rhythm, normal heart sounds and intact distal pulses.  Exam reveals no gallop and no friction rub.   No murmur heard. Pulmonary/Chest: Effort normal and breath sounds normal. No respiratory distress. He has no wheezes. He has no rales. He exhibits tenderness (Reproducible anterior chest wall tenderness).  Abdominal: Soft. Bowel sounds are normal. He exhibits no distension. There is no tenderness. There is no rebound and no guarding.  Neurological: He is alert.  Cranial nerves 2-12 intact. Finger-to-nose is normal. 5/5 motor strength of the bilateral upper and lower extremities. Moves all four extremities. Negative Romberg. Ambulatory without difficulty. NVI.    Skin: Skin is warm and dry.  Psychiatric: His behavior is normal.  Nursing note and vitals reviewed.    ED Treatments / Results  Labs (all labs ordered are listed, but only abnormal results are displayed) Labs Reviewed  BASIC METABOLIC PANEL - Abnormal; Notable for the following:       Result Value   Potassium 3.3 (*)    CO2 19 (*)    All other components within normal limits  CBC - Abnormal; Notable for the following:    RBC 6.10 (*)    Hemoglobin 17.5 (*)    MCHC 36.3 (*)    All other components within normal limits    I-STAT TROPOININ, ED    EKG  EKG Interpretation  Date/Time:  Wednesday March 19 2017 12:38:53 EDT Ventricular Rate:  106 PR Interval:  158 QRS Duration: 86 QT Interval:  336 QTC Calculation: 446 R Axis:   69 Text Interpretation:  Sinus tachycardia Otherwise normal ECG Confirmed by Donnetta Hutching ([id number]) on 03/20/2017 1:01:01 PM       Radiology Dg Chest 2 View  Result Date: 03/19/2017 CLINICAL DATA:  Chest pain EXAM: CHEST  2 VIEW COMPARISON:  03/15/2017 FINDINGS: The heart size and mediastinal contours are within normal limits. Both lungs are clear. The visualized skeletal structures are unremarkable. IMPRESSION: No active cardiopulmonary disease. Electronically Signed   By: Marlan Palau M.D.   On: 03/19/2017 14:06    Procedures Procedures (including critical care time)  Medications Ordered in ED Medications  gi cocktail (Maalox,Lidocaine,Donnatal) (30 mLs Oral Given 03/19/17 1521)     Initial Impression / Assessment and Plan / ED Course  I have reviewed the triage vital signs and the nursing notes.  Pertinent labs & imaging results that were available during my care of the patient were reviewed by me and considered in my medical decision making (see chart for details).     26 year old male presenting with complaints of CP and dizziness. He appears anxious. Normotensive. Non-tachy on the monitor. SaO2 in the upper 90s. CXR unremarkable. EKG unchanged from previous. K 3.3, which appears to be around the patient's baseline. He reports his GERD has been more problematic recently. Will give a GI cocktail in the ED. The patient notes improvement. Reproducible chest wall pain on exam, likely secondary to helping his friend move. Will provide the patient with a short course of Flexeril. At this time, I do not think the dizziness is related to the CP. No focal deficits on neuro exam. Nystagmus noted with EOM. Will provide the patient with a short course of tapered corticosteroid to see  if symptoms improve and ENT referral. Discussed the plan with the patient who is agreeable at this time. Strict return precautions given. NAD. VSS. The patient is stable for discharge at this time.   Final Clinical Impressions(s) / ED Diagnoses   Final diagnoses:  Chest wall pain  Vertigo    New Prescriptions Discharge Medication List as of 03/19/2017  4:41 PM    START taking these medications   Details  cyclobenzaprine (FLEXERIL) 10 MG tablet Take 1 tablet (10 mg total) by mouth 2 (two) times daily as needed for muscle spasms., Starting Wed 03/19/2017, Print    methylPREDNISolone (MEDROL DOSEPAK) 4 MG TBPK tablet Take 6 tabs day 1, 5 tabs day 2, 4 tabs day 3, 3 tabs day 4, 2 tabs day 5, and 1 on day 6, Print         Barkley Boards, PA-C 03/21/17 1154    Geoffery Lyons, MD 03/21/17 2350

## 2017-03-19 NOTE — ED Notes (Signed)
Patient transported to X-ray 

## 2017-03-30 ENCOUNTER — Emergency Department (HOSPITAL_COMMUNITY): Payer: 59

## 2017-03-30 ENCOUNTER — Emergency Department (HOSPITAL_COMMUNITY)
Admission: EM | Admit: 2017-03-30 | Discharge: 2017-03-30 | Disposition: A | Discharge status: Home / Self Care | Payer: 59 | Attending: Emergency Medicine | Admitting: Emergency Medicine

## 2017-03-30 ENCOUNTER — Encounter (HOSPITAL_COMMUNITY): Payer: Self-pay | Admitting: Emergency Medicine

## 2017-03-30 ENCOUNTER — Encounter (HOSPITAL_COMMUNITY): Payer: Self-pay | Admitting: Oncology

## 2017-03-30 ENCOUNTER — Emergency Department (HOSPITAL_COMMUNITY)
Admission: EM | Admit: 2017-03-30 | Discharge: 2017-03-30 | Disposition: A | Discharge status: Home / Self Care | Payer: 59 | Source: Home / Self Care | Attending: Emergency Medicine | Admitting: Emergency Medicine

## 2017-03-30 DIAGNOSIS — Z79899 Other long term (current) drug therapy: Secondary | ICD-10-CM

## 2017-03-30 DIAGNOSIS — M542 Cervicalgia: Secondary | ICD-10-CM | POA: Insufficient documentation

## 2017-03-30 DIAGNOSIS — F419 Anxiety disorder, unspecified: Secondary | ICD-10-CM

## 2017-03-30 DIAGNOSIS — H9203 Otalgia, bilateral: Secondary | ICD-10-CM

## 2017-03-30 MED ORDER — AMOXICILLIN 500 MG PO CAPS: 500.0000 mg | ORAL_CAPSULE | Freq: Three times a day (TID) | ORAL | 0 refills | Status: AC

## 2017-03-30 MED ORDER — IOPAMIDOL (ISOVUE-370) INJECTION 76%
INTRAVENOUS | Status: AC
  Filled 2017-03-30: qty 100

## 2017-03-30 MED ORDER — DIAZEPAM 5 MG PO TABS
5.0000 mg | ORAL_TABLET | Freq: Once | ORAL | Status: AC
  Administered 2017-03-30: 5 mg via ORAL
  Filled 2017-03-30: qty 1

## 2017-03-30 MED ORDER — KETOROLAC TROMETHAMINE 30 MG/ML IJ SOLN
15.0000 mg | Freq: Once | INTRAMUSCULAR | Status: AC
  Administered 2017-03-30: 15 mg via INTRAVENOUS
  Filled 2017-03-30: qty 1

## 2017-03-30 MED ORDER — ACETAMINOPHEN 500 MG PO TABS
1000.0000 mg | ORAL_TABLET | Freq: Once | ORAL | Status: AC
  Administered 2017-03-30: 1000 mg via ORAL
  Filled 2017-03-30: qty 2

## 2017-03-30 MED ORDER — IOPAMIDOL (ISOVUE-370) INJECTION 76%
100.0000 mL | Freq: Once | INTRAVENOUS | Status: AC | PRN
  Administered 2017-03-30: 100 mL via INTRAVENOUS

## 2017-03-30 NOTE — ED Triage Notes (Signed)
Pt c/o neck pain x 1 month.  Per pt the pain has gotten progressively worse.  Pt states that he is hearing a, "Clicking" sound when he turns his neck.  Pt describes the pain as a dull ache that has began to have a burning pain.  Pt reports being seen by a spine specialist when problem first began w/ no acute findings.  Pt also c/o vertigo and b/l ear pain/fullness/ringing/discharge.  States he was dx w/ ear infection 1 week ago.

## 2017-03-30 NOTE — ED Provider Notes (Signed)
[facility]-EMERGENCY DEPT Provider Note   CSN: [id number] Arrival date & time: 03/30/17  0307     History   Chief Complaint Chief Complaint  Patient presents with  . Neck Pain    HPI Earl Foster is a 26 y.o. male.  Patient with PMH of gerd, anxiety, presents to the ED with a chief complaint of neck pain.  Patient states that he has had some intermittent symptoms for the past several months. He reports some intermittent tingling and radiating pain into his upper extremities. He has been seen by a spine specialist, who advised him to follow-up with neurology for an MRI. Patient states that this is scheduled, but he hasn't done yet. He denies any fevers, chills, nausea, vomiting. Denies any numbness, weakness, or tingling. Denies any bowel or bladder incontinence.  Additionally, he reports bilateral ear pain and congestion.   The history is provided by the patient. No language interpreter was used.    Past Medical History:  Diagnosis Date  . Migraine     Patient Active Problem List   Diagnosis Date Noted  . Migraine without aura and without status migrainosus, not intractable 06/07/2015  . Upper airway cough syndrome assoc with mscp L ant chest ? from coughing vs gerd related cp 05/31/2015  . Sinus tachycardia 05/18/2015  . Anxiety 05/18/2015  . Leukocytosis 05/18/2015  . Urinary frequency 05/18/2015  . GERD (gastroesophageal reflux disease) 05/18/2015  . Atypical chest pain 05/18/2015  . Lung bullae (HCC)   . Tachycardia     Past Surgical History:  Procedure Laterality Date  . MYRINGOTOMY    . TONSILLECTOMY         Home Medications    Prior to Admission medications   Medication Sig Start Date End Date Taking? Authorizing Provider  calcium carbonate (TUMS - DOSED IN MG ELEMENTAL CALCIUM) 500 MG chewable tablet Chew 2 tablets by mouth 3 (three) times daily as needed for indigestion or heartburn.    [provider]  cyclobenzaprine (FLEXERIL) 10 MG tablet Take  1 tablet (10 mg total) by mouth 2 (two) times daily as needed for muscle spasms. 03/19/17   McDonald, Mia A, PA-C  Eletriptan Hydrobromide (RELPAX PO) Take 1 tablet by mouth as directed. As needed for HA    [provider]  famotidine (PEPCID) 20 MG tablet Take 1 tablet (20 mg total) by mouth 2 (two) times daily. 03/15/17   Law, Waylan Boga, PA-C  FLUoxetine (PROZAC) 10 MG tablet Take 10 mg by mouth daily.    [provider]  hydrOXYzine (ATARAX/VISTARIL) 25 MG tablet Take 1 tablet (25 mg total) by mouth every 8 (eight) hours as needed for anxiety. 03/07/17   Little, Ambrose Finland, MD  ibuprofen (ADVIL,MOTRIN) 200 MG tablet Take 200 mg by mouth every 6 (six) hours as needed for moderate pain.    [provider]  methylPREDNISolone (MEDROL DOSEPAK) 4 MG TBPK tablet Take 6 tabs day 1, 5 tabs day 2, 4 tabs day 3, 3 tabs day 4, 2 tabs day 5, and 1 on day 6 03/19/17   McDonald, Mia A, PA-C  naproxen sodium (ANAPROX) 550 MG tablet Take 1 tablet (550 mg total) by mouth 2 (two) times daily as needed. 06/07/15   Drema Dallas, DO  omeprazole (PRILOSEC) 20 MG capsule Take 1 capsule (20 mg total) by mouth daily. 02/26/17   Rise Mu, PA-C    Family History Family History  Problem Relation Age of Onset  . Adopted: Yes  Social History Social History  Substance Use Topics  . Smoking status: Never Smoker  . Smokeless tobacco: Never Used  . Alcohol use No     Allergies   Patient has no known allergies.   Review of Systems Review of Systems  All other systems reviewed and are negative.    Physical Exam Updated Vital Signs BP (!) 160/100 (BP Location: Right Arm) Comment: MD at bedside and aware  Pulse 95   Temp 97.6 F (36.4 C) (Oral)   Resp 16   Ht 5\' 10"  (1.778 m)   Wt 80.3 kg (177 lb)   SpO2 99%   BMI 25.40 kg/m   Physical Exam  Constitutional: He is oriented to person, place, and time. He appears well-developed and well-nourished.  HENT:  Head:  Normocephalic and atraumatic.  Eyes: Pupils are equal, round, and reactive to light. Conjunctivae and EOM are normal. Right eye exhibits no discharge. Left eye exhibits no discharge. No scleral icterus.  Neck: Normal range of motion. Neck supple. No JVD present.  Cardiovascular: Normal rate, regular rhythm and normal heart sounds.  Exam reveals no gallop and no friction rub.   No murmur heard. Pulmonary/Chest: Effort normal and breath sounds normal. No respiratory distress. He has no wheezes. He has no rales. He exhibits no tenderness.  Abdominal: Soft. He exhibits no distension and no mass. There is no tenderness. There is no rebound and no guarding.  Musculoskeletal: Normal range of motion. He exhibits no edema or tenderness.  No cervical spine tenderness, moves all extremities  Neurological: He is alert and oriented to person, place, and time.  Normal sensation and strength  Skin: Skin is warm and dry.  Psychiatric: He has a normal mood and affect. His behavior is normal. Judgment and thought content normal.  Nursing note and vitals reviewed.    ED Treatments / Results  Labs (all labs ordered are listed, but only abnormal results are displayed) Labs Reviewed - No data to display  EKG  EKG Interpretation None       Radiology No results found.  Procedures Procedures (including critical care time)  Medications Ordered in ED Medications - No data to display   Initial Impression / Assessment and Plan / ED Course  I have reviewed the triage vital signs and the nursing notes.  Pertinent labs & imaging results that were available during my care of the patient were reviewed by me and considered in my medical decision making (see chart for details).     Patient with back pain.  No neurological deficits and normal neuro exam.  Patient is ambulatory.  No loss of bowel or bladder control.  Doubt cauda equina.  Denies fever,  doubt epidural abscess or other lesion. Recommend back  exercises, stretching, RICE.  Encouraged the patient that there could be a need for additional workup and/or imaging such as MRI, if the symptoms do not resolve. Patient advised that if the back pain does not resolve, or radiates, this could progress to more serious conditions and is encouraged to follow-up with PCP or orthopedics within 2 weeks.    Will treat ear pain and congestion with amox.  Final Clinical Impressions(s) / ED Diagnoses   Final diagnoses:  Neck pain  Otalgia of both ears    New Prescriptions New Prescriptions   AMOXICILLIN (AMOXIL) 500 MG CAPSULE    Take 1 capsule (500 mg total) by mouth 3 (three) times daily.     Roxy HorsemanBrowning, Savi Lastinger, PA-C 03/30/17 [id number]  Zadie Rhine, MD 03/30/17 0500

## 2017-03-30 NOTE — ED Provider Notes (Signed)
[facility]-EMERGENCY DEPT Provider Note   CSN: [id number] Arrival date & time: 03/30/17  4098     History   Chief Complaint Chief Complaint  Patient presents with  . Neck Pain    HPI Earl Foster is a 26 y.o. male.  26 yo M with a chief complaint of neck pain. The patient has had this issue for the past month. He has seen multiple physicians for the same. Was referred to neurology but has not yet made an appointment to see them. Felt that the pain had gotten worse today so went to the [facility] ED. At that time he had no significant findings and was discharged home. Has the patient got to the car he felt that he suddenly had double vision. He feels that he sees things split side-by-side. He then started feeling that the pain in his neck was sharper than before. He looked at his discharge instructions and lo and behold that was listed as a reason to return, and instead of turning around and going right back to the hospital when she was in he drove to [facility] long hospital.   The history is provided by the patient.  Neck Pain   This is a new problem. Pertinent negatives include no chest pain and no headaches.    Past Medical History:  Diagnosis Date  . Migraine     Patient Active Problem List   Diagnosis Date Noted  . Migraine without aura and without status migrainosus, not intractable 06/07/2015  . Upper airway cough syndrome assoc with mscp L ant chest ? from coughing vs gerd related cp 05/31/2015  . Sinus tachycardia 05/18/2015  . Anxiety 05/18/2015  . Leukocytosis 05/18/2015  . Urinary frequency 05/18/2015  . GERD (gastroesophageal reflux disease) 05/18/2015  . Atypical chest pain 05/18/2015  . Lung bullae (HCC)   . Tachycardia     Past Surgical History:  Procedure Laterality Date  . MYRINGOTOMY    . TONSILLECTOMY         Home Medications    Prior to Admission medications   Medication Sig Start Date End Date Taking? Authorizing Provider  amoxicillin (AMOXIL)  500 MG capsule Take 1 capsule (500 mg total) by mouth 3 (three) times Foster. 03/30/17   Roxy Horseman, PA-C  calcium carbonate (TUMS - DOSED IN MG ELEMENTAL CALCIUM) 500 MG chewable tablet Chew 2 tablets by mouth 3 (three) times Foster as needed for indigestion or heartburn.    [provider]  cyclobenzaprine (FLEXERIL) 10 MG tablet Take 1 tablet (10 mg total) by mouth 2 (two) times Foster as needed for muscle spasms. 03/19/17   McDonald, Mia A, PA-C  Eletriptan Hydrobromide (RELPAX PO) Take 1 tablet by mouth as directed. As needed for HA    [provider]  famotidine (PEPCID) 20 MG tablet Take 1 tablet (20 mg total) by mouth 2 (two) times Foster. 03/15/17   Law, Waylan Boga, PA-C  FLUoxetine (PROZAC) 10 MG tablet Take 10 mg by mouth Foster.    [provider]  hydrOXYzine (ATARAX/VISTARIL) 25 MG tablet Take 1 tablet (25 mg total) by mouth every 8 (eight) hours as needed for anxiety. 03/07/17   Little, Ambrose Finland, MD  ibuprofen (ADVIL,MOTRIN) 200 MG tablet Take 200 mg by mouth every 6 (six) hours as needed for moderate pain.    [provider]  methylPREDNISolone (MEDROL DOSEPAK) 4 MG TBPK tablet Take 6 tabs day 1, 5 tabs day 2, 4 tabs day 3, 3 tabs day 4, 2  tabs day 5, and 1 on day 6 03/19/17   McDonald, Mia A, PA-C  naproxen sodium (ANAPROX) 550 MG tablet Take 1 tablet (550 mg total) by mouth 2 (two) times Foster as needed. 06/07/15   Drema DallasJaffe, Adam R, DO  omeprazole (PRILOSEC) 20 MG capsule Take 1 capsule (20 mg total) by mouth Foster. 02/26/17   Rise MuLeaphart, Kenneth T, PA-C    Family History Family History  Problem Relation Age of Onset  . Adopted: Yes    Social History Social History  Substance Use Topics  . Smoking status: Never Smoker  . Smokeless tobacco: Never Used  . Alcohol use No     Allergies   Patient has no known allergies.   Review of Systems Review of Systems  Constitutional: Negative for chills and fever.  HENT: Negative for congestion and  facial swelling.   Eyes: Positive for visual disturbance. Negative for discharge.  Respiratory: Negative for shortness of breath.   Cardiovascular: Negative for chest pain and palpitations.  Gastrointestinal: Negative for abdominal pain, diarrhea and vomiting.  Musculoskeletal: Positive for neck pain. Negative for arthralgias and myalgias.  Skin: Negative for color change and rash.  Neurological: Negative for tremors, syncope and headaches.  Psychiatric/Behavioral: Negative for confusion and dysphoric mood.     Physical Exam Updated Vital Signs BP 139/85 (BP Location: Left Arm)   Pulse 77   Temp 97.8 F (36.6 C) (Oral)   Resp 20   Ht 5\' 9"  (1.753 m)   Wt 80.3 kg (177 lb)   SpO2 96%   BMI 26.14 kg/m   Physical Exam  Constitutional: He is oriented to person, place, and time. He appears well-developed and well-nourished.  HENT:  Head: Normocephalic and atraumatic.  Eyes: Pupils are equal, round, and reactive to light. EOM are normal.  Neck: Normal range of motion. Neck supple. No JVD present.  Cardiovascular: Normal rate and regular rhythm.  Exam reveals no gallop and no friction rub.   No murmur heard. Pulmonary/Chest: No respiratory distress. He has no wheezes.  Abdominal: He exhibits no distension. There is no rebound and no guarding.  Musculoskeletal: Normal range of motion.  Neurological: He is alert and oriented to person, place, and time. He has normal strength. No cranial nerve deficit or sensory deficit. He displays a negative Romberg sign. Coordination and gait normal. GCS eye subscore is 4. GCS verbal subscore is 5. GCS motor subscore is 6. He displays no Babinski's sign on the right side. He displays no Babinski's sign on the left side.  Reflex Scores:      Tricep reflexes are 2+ on the right side and 2+ on the left side.      Bicep reflexes are 2+ on the right side and 2+ on the left side.      Brachioradialis reflexes are 2+ on the right side and 2+ on the left  side.      Patellar reflexes are 2+ on the right side and 2+ on the left side.      Achilles reflexes are 2+ on the right side and 2+ on the left side. No noted clonus  Skin: No rash noted. No pallor.  Psychiatric: He has a normal mood and affect. His behavior is normal.  Nursing note and vitals reviewed.    ED Treatments / Results  Labs (all labs ordered are listed, but only abnormal results are displayed) Labs Reviewed - No data to display  EKG  EKG Interpretation None  Radiology Ct Angio Head W Or Wo Contrast  Result Date: 03/30/2017 CLINICAL DATA:  Neck pain, visual change EXAM: CT ANGIOGRAPHY HEAD AND NECK TECHNIQUE: Multidetector CT imaging of the head and neck was performed using the standard protocol during bolus administration of intravenous contrast. Multiplanar CT image reconstructions and MIPs were obtained to evaluate the vascular anatomy. Carotid stenosis measurements (when applicable) are obtained utilizing NASCET criteria, using the distal internal carotid diameter as the denominator. CONTRAST:  100 mL Isovue 370 IV COMPARISON:  None. FINDINGS: CT HEAD FINDINGS Brain: No evidence of acute infarction, hemorrhage, hydrocephalus, extra-axial collection or mass lesion/mass effect. Vascular: No hyperdense vessel or unexpected calcification. Skull: Negative Sinuses: Negative Orbits: Negative Review of the MIP images confirms the above findings CTA NECK FINDINGS Aortic arch: Normal Right carotid system: Normal Left carotid system: Normal Vertebral arteries: Normal Skeleton: Normal Other neck: Negative for mass or adenopathy in the neck. Upper chest: Negative Review of the MIP images confirms the above findings CTA HEAD FINDINGS Anterior circulation: Cavernous carotid normal bilaterally without stenosis or aneurysm. Anterior and middle cerebral arteries appear normal bilaterally Posterior circulation: Both vertebral arteries patent to the basilar. Basilar widely patent. PICA,  superior cerebellar, posterior cerebral artery is normal bilaterally. Venous sinuses: Patent sinuses with hypoplasia of the left transverse sinus. No thrombosis identified. Anatomic variants: None Delayed phase: Normal enhancement Review of the MIP images confirms the above findings IMPRESSION: Negative CTA head and neck.  No dissection or stenosis identified. Electronically Signed   By: Marlan Palau M.D.   On: 03/30/2017 08:00   Ct Angio Neck W And/or Wo Contrast  Result Date: 03/30/2017 CLINICAL DATA:  Neck pain, visual change EXAM: CT ANGIOGRAPHY HEAD AND NECK TECHNIQUE: Multidetector CT imaging of the head and neck was performed using the standard protocol during bolus administration of intravenous contrast. Multiplanar CT image reconstructions and MIPs were obtained to evaluate the vascular anatomy. Carotid stenosis measurements (when applicable) are obtained utilizing NASCET criteria, using the distal internal carotid diameter as the denominator. CONTRAST:  100 mL Isovue 370 IV COMPARISON:  None. FINDINGS: CT HEAD FINDINGS Brain: No evidence of acute infarction, hemorrhage, hydrocephalus, extra-axial collection or mass lesion/mass effect. Vascular: No hyperdense vessel or unexpected calcification. Skull: Negative Sinuses: Negative Orbits: Negative Review of the MIP images confirms the above findings CTA NECK FINDINGS Aortic arch: Normal Right carotid system: Normal Left carotid system: Normal Vertebral arteries: Normal Skeleton: Normal Other neck: Negative for mass or adenopathy in the neck. Upper chest: Negative Review of the MIP images confirms the above findings CTA HEAD FINDINGS Anterior circulation: Cavernous carotid normal bilaterally without stenosis or aneurysm. Anterior and middle cerebral arteries appear normal bilaterally Posterior circulation: Both vertebral arteries patent to the basilar. Basilar widely patent. PICA, superior cerebellar, posterior cerebral artery is normal bilaterally.  Venous sinuses: Patent sinuses with hypoplasia of the left transverse sinus. No thrombosis identified. Anatomic variants: None Delayed phase: Normal enhancement Review of the MIP images confirms the above findings IMPRESSION: Negative CTA head and neck.  No dissection or stenosis identified. Electronically Signed   By: Marlan Palau M.D.   On: 03/30/2017 08:00    Procedures Procedures (including critical care time)  Medications Ordered in ED Medications  ketorolac (TORADOL) 30 MG/ML injection 15 mg (15 mg Intravenous Given 03/30/17 0638)  acetaminophen (TYLENOL) tablet 1,000 mg (1,000 mg Oral Given 03/30/17 0637)  diazepam (VALIUM) tablet 5 mg (5 mg Oral Given 03/30/17 0637)  iopamidol (ISOVUE-370) 76 % injection 100 mL (100 mLs  Intravenous Contrast Given 03/30/17 0721)     Initial Impression / Assessment and Plan / ED Course  I have reviewed the triage vital signs and the nursing notes.  Pertinent labs & imaging results that were available during my care of the patient were reviewed by me and considered in my medical decision making (see chart for details).     26 yo M With a chief complaint of neck pain and double vision. He has no neurologic deficits other than the subjective one mentioned prior. I discussed with the patient the limited utility of imaging in this context however the patient is concerned that there is something severe going on. I'll obtain a CT angiogram of the neck to evaluate for vertebral artery dissection.    Medications given during this visit Medications  ketorolac (TORADOL) 30 MG/ML injection 15 mg (15 mg Intravenous Given 03/30/17 1610)  acetaminophen (TYLENOL) tablet 1,000 mg (1,000 mg Oral Given 03/30/17 9604)  diazepam (VALIUM) tablet 5 mg (5 mg Oral Given 03/30/17 0637)  iopamidol (ISOVUE-370) 76 % injection 100 mL (100 mLs Intravenous Contrast Given 03/30/17 0721)     The patient appears reasonably screen and/or stabilized for discharge and I doubt any  other medical condition or other [facility] requiring further screening, evaluation, or treatment in the ED at this time prior to discharge.    Final Clinical Impressions(s) / ED Diagnoses   Final diagnoses:  Neck pain    New Prescriptions Discharge Medication List as of 03/30/2017  8:34 AM       Melene Plan, DO 03/30/17 1636

## 2017-03-30 NOTE — ED Triage Notes (Signed)
Pt reports that he has been having neck pain for the last month that has began getting worse along with blurred vision. PT [facility] for same complaint but now is having shooting pain.

## 2017-03-30 NOTE — Discharge Instructions (Signed)
Follow-up [facility] neurologist as [facility] on [facility].

## 2017-03-30 NOTE — ED Provider Notes (Signed)
Angiogram with no acute findings. Patient states that he does not have diploplia now. He has no extra ocular movement palsy. He reports normal gross vision and can read objects in the room. No other cranial nerve abnormalities. He is able toward the bathroom with normal gait. He hasn't appointment 7/31 with [facility]. Encouraged to keep this appointment.   Rolland PorterJames, Aster Eckrich, MD 03/30/17 [phone]

## 2017-11-07 ENCOUNTER — Ambulatory Visit (HOSPITAL_COMMUNITY)
Admission: RE | Admit: 2017-11-07 | Discharge: 2017-11-07 | Disposition: A | Discharge status: Home / Self Care | Payer: BLUE CROSS/BLUE SHIELD | Attending: Psychiatry | Admitting: Psychiatry

## 2017-11-07 DIAGNOSIS — R61 Generalized hyperhidrosis: Secondary | ICD-10-CM | POA: Diagnosis not present

## 2017-11-07 DIAGNOSIS — F329 Major depressive disorder, single episode, unspecified: Secondary | ICD-10-CM | POA: Insufficient documentation
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
## 2017-11-07 NOTE — H&P (Signed)
Behavioral Health Medical Screening Exam  Earl Foster is an 27 y.o. male.  Total Time spent with patient: 15 minutes  Psychiatric Specialty Exam: Physical Exam  Constitutional: He is oriented to person, place, and time. He appears well-developed and well-nourished. No distress.  HENT:  Head: Normocephalic and atraumatic.  Right Ear: External ear normal.  Left Ear: External ear normal.  Eyes: Conjunctivae are normal. Right eye exhibits no discharge. Left eye exhibits no discharge. No scleral icterus.  Cardiovascular: Normal rate.  Respiratory: Effort normal. No respiratory distress.  Musculoskeletal: Normal range of motion.  Neurological: He is alert and oriented to person, place, and time.  Skin: Skin is warm and dry. He is not diaphoretic.  Psychiatric: His speech is normal. His mood appears anxious. His affect is not blunt and not labile. He is not agitated, not withdrawn and not actively hallucinating. Thought content is not paranoid and not delusional. Cognition and memory are normal. He does not express impulsivity or inappropriate judgment. He exhibits a depressed mood. He expresses no homicidal and no suicidal ideation.    Review of Systems  Constitutional: Positive for diaphoresis. Negative for chills, fever and weight loss.  Respiratory: Negative for shortness of breath.   Cardiovascular: Negative for chest pain.  Psychiatric/Behavioral: Positive for depression. Negative for hallucinations, memory loss, substance abuse and suicidal ideas. The patient is nervous/anxious and has insomnia.   All other systems reviewed and are negative.   Blood pressure (!) 143/86, pulse 87, temperature 98.6 F (37 C), resp. rate 16, SpO2 99 %.There is no height or weight on file to calculate BMI.  General Appearance: Casual and Well Groomed  Eye Contact:  Good  Speech:  Clear and Coherent and Normal Rate  Volume:  Normal  Mood:  Anxious and Depressed  Affect:  Congruent and Depressed   Thought Process:  Coherent, Goal Directed and Descriptions of Associations: Intact  Orientation:  Full (Time, Place, and Person)  Thought Content:  Logical and Hallucinations: None  Suicidal Thoughts:  No  Homicidal Thoughts:  No  Memory:  Immediate;   Good Recent;   Good  Judgement:  Intact  Insight:  Good  Psychomotor Activity:  Normal  Concentration: Concentration: Good and Attention Span: Good  Recall:  Good  Fund of Knowledge:Good  Language: Good  Akathisia:  No  Handed:  Right  AIMS (if indicated):     Assets:  Communication Skills Desire for Improvement Financial Resources/Insurance Housing Intimacy Leisure Time Physical Health Transportation  Sleep:       Musculoskeletal: Strength & Muscle Tone: within normal limits Gait & Station: normal   Blood pressure (!) 143/86, pulse 87, temperature 98.6 F (37 C), resp. rate 16, SpO2 99 %.  Recommendations:  Based on my evaluation the patient does not appear to have an emergency medical condition.  No evidence of imminent risk to self or others at present.   Patient does not meet criteria for psychiatric inpatient admission. Supportive therapy provided about ongoing stressors. Discussed crisis plan, support from social network, calling 911, coming to the Emergency Department, and calling Suicide Hotline. Patient receives [facility] through [facility]  Jackelyn PolingJason A Noriah Osgood, NP 11/07/2017, 11:21 PM

## 2017-11-27 ENCOUNTER — Ambulatory Visit (HOSPITAL_COMMUNITY)
Admission: RE | Admit: 2017-11-27 | Discharge: 2017-11-27 | Disposition: A | Discharge status: Home / Self Care | Payer: BLUE CROSS/BLUE SHIELD | Attending: Psychiatry | Admitting: Psychiatry

## 2017-11-27 DIAGNOSIS — F411 Generalized anxiety disorder: Secondary | ICD-10-CM | POA: Diagnosis not present

## 2017-11-27 NOTE — H&P (Signed)
Behavioral Health Medical Screening Exam  Earl Foster is an 27 y.o. male who [facility] with concerns for increasing anxiety c/o panic attacks, insomnia, worrying, racing thoughts and rumination. He was recently put on Buspar and taking off of Prozac, while at [facility] recently. He has a therapist and medication mgmt provider at the [facility]. He  Was last seen there within the last month. He is denying any SI/SA or HI. He endorses concerns with GERD, but otherwise has no additional health concerns.  Total Time spent with patient: 20 minutes  Psychiatric Specialty Exam: Physical Exam  Constitutional: He is oriented to person, place, and time. He appears well-developed and well-nourished. No distress.  HENT:  Head: Normocephalic.  Eyes: Pupils are equal, round, and reactive to light.  Respiratory: Effort normal and breath sounds normal. No respiratory distress.  Neurological: He is alert and oriented to person, place, and time. No cranial nerve deficit.  Skin: Skin is warm and dry. He is not diaphoretic.  Psychiatric: His speech is normal. Judgment normal. His mood appears anxious. He is agitated. Cognition and memory are normal. He expresses no homicidal and no suicidal ideation.    Review of Systems  Constitutional: Negative for chills, diaphoresis, fever, malaise/fatigue and weight loss.  Respiratory: Negative for shortness of breath.   Cardiovascular: Negative for chest pain.  Gastrointestinal: Positive for heartburn. Negative for nausea and vomiting.  Psychiatric/Behavioral: Negative for depression, hallucinations, substance abuse and suicidal ideas. The patient is nervous/anxious and has insomnia.     There were no vitals taken for this visit.There is no height or weight on file to calculate BMI.  General Appearance: Casual  Eye Contact:  Good  Speech:  Clear and Coherent  Volume:  Normal  Mood:  Anxious  Affect:  Appropriate  Thought Process:  Goal Directed   Orientation:  Full (Time, Place, and Person)  Thought Content:  Logical  Suicidal Thoughts:  No  Homicidal Thoughts:  No  Memory:  Immediate;   Fair  Judgement:  Fair  Insight:  Fair  Psychomotor Activity:  Normal  Concentration: Concentration: Fair  Recall:  FiservFair  Fund of Knowledge:Fair  Language: Fair  Akathisia:  Negative  Handed:  Right  AIMS (if indicated):     Assets:  Desire for Improvement  Sleep:       Musculoskeletal: Strength & Muscle Tone: within normal limits Gait & Station: normal Patient leans: N/A  There were no vitals taken for this visit.  Recommendations:  Based on my evaluation the patient does not appear to have an emergency medical condition.  Kerry HoughSpencer E Simon, PA-C 11/27/2017, 5:54 AM
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
## 2023-10-20 DIAGNOSIS — H10021 Other mucopurulent conjunctivitis, right eye: Secondary | ICD-10-CM | POA: Diagnosis not present

## 2023-10-20 DIAGNOSIS — H01001 Unspecified blepharitis right upper eyelid: Secondary | ICD-10-CM | POA: Diagnosis not present

## 2024-10-09 ENCOUNTER — Encounter (HOSPITAL_COMMUNITY): Payer: Self-pay

## 2024-10-09 ENCOUNTER — Other Ambulatory Visit: Payer: Self-pay

## 2024-10-09 ENCOUNTER — Emergency Department (HOSPITAL_COMMUNITY)
Admission: EM | Admit: 2024-10-09 | Discharge: 2024-10-09 | Disposition: A | Discharge status: Home / Self Care | Payer: Self-pay | Attending: Emergency Medicine | Admitting: Emergency Medicine

## 2024-10-09 DIAGNOSIS — T7840XA Allergy, unspecified, initial encounter: Secondary | ICD-10-CM | POA: Insufficient documentation

## 2024-10-09 MED ORDER — PREDNISONE 20 MG PO TABS: ORAL_TABLET | ORAL | 0 refills | Status: AC

## 2024-10-09 MED ORDER — PREDNISONE 20 MG PO TABS
60.0000 mg | ORAL_TABLET | Freq: Once | ORAL | Status: AC
  Administered 2024-10-09: 60 mg via ORAL
  Filled 2024-10-09: qty 3

## 2024-10-09 NOTE — ED Provider Notes (Signed)
 " [place] EMERGENCY DEPARTMENT AT [facility] Provider Note   CSN: [id number] Arrival date & time: 10/09/24  9796     Patient presents with: Allergic Reaction   Earl Foster is a 34 y.o. male.   The history is provided by the patient and medical records.  Allergic Reaction  34 y.o. M with hx of anxiety, migraine headaches, presenting to the ED with concern for allergic reaction.  States he has conjunctivitis and was put on eye drops by his doctor but also told to do epsom salt compresses.  Tried this tonight and broke out in hives on his face.  He did take claritin at home and was given 50mg  benadryl with EMS.  By time of my evaluation states he is feeling better.  Prior to Admission medications  Medication Sig Start Date End Date Taking? Authorizing Provider  predniSONE  (DELTASONE ) 20 MG tablet Take 40 mg by mouth daily for 3 days, then 20mg  by mouth daily for 3 days, then 10mg  daily for 3 days 10/09/24  Yes Jarold Olam HERO, PA-C  amoxicillin  (AMOXIL ) 500 MG capsule Take 1 capsule (500 mg total) by mouth 3 (three) times daily. 03/30/17   Vicky Charleston, PA-C  calcium  carbonate (TUMS - DOSED IN MG ELEMENTAL CALCIUM ) 500 MG chewable tablet Chew 2 tablets by mouth 3 (three) times daily as needed for indigestion or heartburn.    [provider]  cyclobenzaprine  (FLEXERIL ) 10 MG tablet Take 1 tablet (10 mg total) by mouth 2 (two) times daily as needed for muscle spasms. 03/19/17   McDonald, Mia A, PA-C  Eletriptan Hydrobromide (RELPAX PO) Take 1 tablet by mouth as directed. As needed for HA    [provider]  famotidine  (PEPCID ) 20 MG tablet Take 1 tablet (20 mg total) by mouth 2 (two) times daily. 03/15/17   Law, Alexandra M, PA-C  FLUoxetine (PROZAC) 10 MG tablet Take 10 mg by mouth daily.    [provider]  hydrOXYzine  (ATARAX /VISTARIL ) 25 MG tablet Take 1 tablet (25 mg total) by mouth every 8 (eight) hours as needed for anxiety. 03/07/17   Little,  Vernell Search, MD  ibuprofen (ADVIL,MOTRIN) 200 MG tablet Take 200 mg by mouth every 6 (six) hours as needed for moderate pain.    [provider]  methylPREDNISolone  (MEDROL  DOSEPAK) 4 MG TBPK tablet Take 6 tabs day 1, 5 tabs day 2, 4 tabs day 3, 3 tabs day 4, 2 tabs day 5, and 1 on day 6 03/19/17   McDonald, Mia A, PA-C  naproxen  sodium (ANAPROX ) 550 MG tablet Take 1 tablet (550 mg total) by mouth 2 (two) times daily as needed. 06/07/15   Skeet, Adam R, DO  omeprazole  (PRILOSEC) 20 MG capsule Take 1 capsule (20 mg total) by mouth daily. 02/26/17   Annabell Vinie DASEN, PA-C    Allergies: Patient has no known allergies.    Review of Systems  Constitutional:        Allergic rxn  All other systems reviewed and are negative.   Updated Vital Signs BP (!) 151/98 (BP Location: Left Arm)   Pulse 98   Temp 98.7 F (37.1 C) (Oral)   Resp 18   Ht 5' 9 (1.753 m)   Wt 80.3 kg   SpO2 98%   BMI 26.14 kg/m   Physical Exam Vitals and nursing note reviewed.  Constitutional:      Appearance: He is well-developed.  HENT:     Head: Normocephalic and atraumatic.  Comments: No facial rash or flushing noted    Mouth/Throat:     Comments: No lip tongue/swelling, handling secretions well, normal phonation without stridor Eyes:     Conjunctiva/sclera: Conjunctivae normal.     Pupils: Pupils are equal, round, and reactive to light.  Cardiovascular:     Rate and Rhythm: Normal rate and regular rhythm.     Heart sounds: Normal heart sounds.  Pulmonary:     Effort: Pulmonary effort is normal.     Breath sounds: Normal breath sounds.  Abdominal:     General: Bowel sounds are normal.     Palpations: Abdomen is soft.  Musculoskeletal:        General: Normal range of motion.     Cervical back: Normal range of motion.  Skin:    General: Skin is warm and dry.     Comments: No bodily rashes  Neurological:     Mental Status: He is alert and oriented to person, place, and time.     (all  labs ordered are listed, but only abnormal results are displayed) Labs Reviewed - No data to display  EKG: None  Radiology: No results found.   Procedures   Medications Ordered in the ED  predniSONE  (DELTASONE ) tablet 60 mg (60 mg Oral Given 10/09/24 0458)                                    Medical Decision Making Risk Prescription drug management.   34 year old M here after allergic reaction to epsom salt compress.  Did take Claritin and was given benadryl PTA.  By time of my evaluation he states he is feeling better.  He has no facial rash, swelling, or oropharyngeal swelling.  He is handling secretions well, normal phonation without stridor.  No signs or symptoms concerning for anaphylaxis at this time.  Feel it would be reasonable for short prednisone  taper, first dose given here.  Can follow-up with PCP.  Return here for new concerns.  Final diagnoses:  Allergic reaction, initial encounter    ED Discharge Orders          Ordered    predniSONE  (DELTASONE ) 20 MG tablet        10/09/24 0440               Jarold Olam HERO, PA-C 10/09/24 0550    Palumbo, April, MD 10/09/24 9379  "

## 2024-10-09 NOTE — ED Notes (Signed)
 Pt states his throat feels sore and scratchy, feels like it may be hard to breathe but states it may also be his severe anxiety and on the out breathe it feels like its sticking Pt is conversing normally and states were it up to me I would rather just get the epi shot and be done with

## 2024-10-09 NOTE — ED Notes (Signed)
 First poc with patient. PT axox4. GCS 15.  PT standing, but asked to lay on bed to obtain vs.  Pt compliant, pleasant, but anxious.  VS obtained.  No respiratory distress noted.  PT states he believes he had allergic reaction to epsom salt earlier due to sensitivity to most things topical.  Took claritin at home and received benadryl 50mg  by mouth en route by EMS. PT denies trouble breathing or swallowing but c/o itchy throat.  Pt also states hx of severe anxiety, especially in encounters such as this.

## 2024-10-09 NOTE — ED Triage Notes (Signed)
 Pt comes via [facility] for allergic reaction, pt has conjunctivitis and pt epsom salt compress to his face and later broke out in hives on his face, PTA receive 50 mg of benadryl

## 2024-10-09 NOTE — ED Notes (Signed)
 Pt axox4. GCS 15. PT verbalizes understanding of discharge instructions, follow up and new RX. PT ambulated out of er with steady gait to waiting area to await [place] services
